# Patient Record
Sex: Female | Born: 1960 | Race: White | Hispanic: No | Smoking: Never smoker
Health system: Southern US, Community
[De-identification: ages and names within clinical notes are randomized; demographics above are authoritative.]

## PROBLEM LIST (undated history)

## (undated) DIAGNOSIS — E079 Disorder of thyroid, unspecified: Secondary | ICD-10-CM

## (undated) DIAGNOSIS — I10 Essential (primary) hypertension: Secondary | ICD-10-CM

---

## 1998-09-30 ENCOUNTER — Encounter (INDEPENDENT_AMBULATORY_CARE_PROVIDER_SITE_OTHER): Payer: Self-pay

## 1998-09-30 ENCOUNTER — Ambulatory Visit (HOSPITAL_COMMUNITY): Admission: RE | Admit: 1998-09-30 | Discharge: 1998-09-30 | Payer: Self-pay | Admitting: Obstetrics and Gynecology

## 1999-11-02 ENCOUNTER — Encounter: Payer: Self-pay | Admitting: Family Medicine

## 1999-11-02 ENCOUNTER — Encounter: Admission: RE | Admit: 1999-11-02 | Discharge: 1999-11-02 | Payer: Self-pay | Admitting: Family Medicine

## 2001-06-20 ENCOUNTER — Encounter: Admission: RE | Admit: 2001-06-20 | Discharge: 2001-06-20 | Payer: Self-pay | Admitting: Family Medicine

## 2001-06-20 ENCOUNTER — Encounter: Payer: Self-pay | Admitting: Family Medicine

## 2002-07-17 ENCOUNTER — Encounter: Payer: Self-pay | Admitting: Family Medicine

## 2002-07-17 ENCOUNTER — Encounter: Admission: RE | Admit: 2002-07-17 | Discharge: 2002-07-17 | Payer: Self-pay | Admitting: Family Medicine

## 2003-08-20 ENCOUNTER — Encounter: Admission: RE | Admit: 2003-08-20 | Discharge: 2003-08-20 | Payer: Self-pay | Admitting: Family Medicine

## 2004-11-17 ENCOUNTER — Encounter: Admission: RE | Admit: 2004-11-17 | Discharge: 2004-11-17 | Payer: Self-pay | Admitting: Family Medicine

## 2006-04-04 ENCOUNTER — Encounter: Admission: RE | Admit: 2006-04-04 | Discharge: 2006-04-04 | Payer: Self-pay | Admitting: Family Medicine

## 2007-07-14 ENCOUNTER — Encounter: Admission: RE | Admit: 2007-07-14 | Discharge: 2007-07-14 | Payer: Self-pay | Admitting: Family Medicine

## 2008-10-21 ENCOUNTER — Encounter: Admission: RE | Admit: 2008-10-21 | Discharge: 2008-10-21 | Payer: Self-pay | Admitting: Family Medicine

## 2009-11-14 ENCOUNTER — Encounter: Admission: RE | Admit: 2009-11-14 | Discharge: 2009-11-14 | Payer: Self-pay | Admitting: Family Medicine

## 2010-01-29 ENCOUNTER — Encounter: Payer: Self-pay | Admitting: Family Medicine

## 2011-02-22 ENCOUNTER — Other Ambulatory Visit: Payer: Self-pay | Admitting: Family Medicine

## 2011-02-22 DIAGNOSIS — Z1231 Encounter for screening mammogram for malignant neoplasm of breast: Secondary | ICD-10-CM

## 2011-02-26 ENCOUNTER — Ambulatory Visit: Payer: Self-pay

## 2011-03-12 ENCOUNTER — Ambulatory Visit
Admission: RE | Admit: 2011-03-12 | Discharge: 2011-03-12 | Disposition: A | Discharge status: Home / Self Care | Payer: BC Managed Care – PPO | Source: Ambulatory Visit | Attending: Family Medicine | Admitting: Family Medicine

## 2011-03-12 DIAGNOSIS — Z1231 Encounter for screening mammogram for malignant neoplasm of breast: Secondary | ICD-10-CM

## 2012-06-12 ENCOUNTER — Other Ambulatory Visit: Payer: Self-pay

## 2012-06-12 DIAGNOSIS — Z1231 Encounter for screening mammogram for malignant neoplasm of breast: Secondary | ICD-10-CM

## 2012-07-02 ENCOUNTER — Ambulatory Visit
Admission: RE | Admit: 2012-07-02 | Discharge: 2012-07-02 | Disposition: A | Discharge status: Home / Self Care | Payer: 59 | Source: Ambulatory Visit

## 2012-07-02 DIAGNOSIS — Z1231 Encounter for screening mammogram for malignant neoplasm of breast: Secondary | ICD-10-CM

## 2013-08-18 ENCOUNTER — Other Ambulatory Visit: Payer: Self-pay

## 2013-08-18 DIAGNOSIS — Z1231 Encounter for screening mammogram for malignant neoplasm of breast: Secondary | ICD-10-CM

## 2013-09-02 ENCOUNTER — Ambulatory Visit: Payer: 59

## 2013-09-09 ENCOUNTER — Ambulatory Visit
Admission: RE | Admit: 2013-09-09 | Discharge: 2013-09-09 | Disposition: A | Discharge status: Home / Self Care | Payer: 59 | Source: Ambulatory Visit

## 2013-09-09 DIAGNOSIS — Z1231 Encounter for screening mammogram for malignant neoplasm of breast: Secondary | ICD-10-CM

## 2014-09-10 ENCOUNTER — Other Ambulatory Visit: Payer: Self-pay

## 2014-09-10 DIAGNOSIS — Z1231 Encounter for screening mammogram for malignant neoplasm of breast: Secondary | ICD-10-CM

## 2014-09-27 ENCOUNTER — Ambulatory Visit: Payer: Self-pay

## 2014-10-04 ENCOUNTER — Ambulatory Visit
Admission: RE | Admit: 2014-10-04 | Discharge: 2014-10-04 | Disposition: A | Discharge status: Home / Self Care | Payer: 59 | Source: Ambulatory Visit

## 2014-10-04 DIAGNOSIS — Z1231 Encounter for screening mammogram for malignant neoplasm of breast: Secondary | ICD-10-CM

## 2015-09-13 ENCOUNTER — Other Ambulatory Visit: Payer: Self-pay | Admitting: Internal Medicine

## 2015-09-13 ENCOUNTER — Other Ambulatory Visit: Payer: Self-pay | Admitting: Family Medicine

## 2015-09-13 DIAGNOSIS — Z1231 Encounter for screening mammogram for malignant neoplasm of breast: Secondary | ICD-10-CM

## 2015-10-06 ENCOUNTER — Ambulatory Visit
Admission: RE | Admit: 2015-10-06 | Discharge: 2015-10-06 | Disposition: A | Discharge status: Home / Self Care | Payer: 59 | Source: Ambulatory Visit | Attending: Internal Medicine | Admitting: Internal Medicine

## 2015-10-06 DIAGNOSIS — Z1231 Encounter for screening mammogram for malignant neoplasm of breast: Secondary | ICD-10-CM

## 2015-10-12 ENCOUNTER — Other Ambulatory Visit: Payer: Self-pay | Admitting: Internal Medicine

## 2015-10-12 DIAGNOSIS — R928 Other abnormal and inconclusive findings on diagnostic imaging of breast: Secondary | ICD-10-CM

## 2015-10-17 ENCOUNTER — Ambulatory Visit
Admission: RE | Admit: 2015-10-17 | Discharge: 2015-10-17 | Disposition: A | Discharge status: Home / Self Care | Payer: 59 | Source: Ambulatory Visit | Attending: Internal Medicine | Admitting: Internal Medicine

## 2015-10-17 DIAGNOSIS — R928 Other abnormal and inconclusive findings on diagnostic imaging of breast: Secondary | ICD-10-CM

## 2016-05-28 ENCOUNTER — Other Ambulatory Visit: Payer: Self-pay | Admitting: Internal Medicine

## 2016-05-28 DIAGNOSIS — N6489 Other specified disorders of breast: Secondary | ICD-10-CM

## 2016-06-05 ENCOUNTER — Ambulatory Visit
Admission: RE | Admit: 2016-06-05 | Discharge: 2016-06-05 | Disposition: A | Discharge status: Home / Self Care | Payer: 59 | Source: Ambulatory Visit | Attending: Internal Medicine | Admitting: Internal Medicine

## 2016-06-05 DIAGNOSIS — N6489 Other specified disorders of breast: Secondary | ICD-10-CM

## 2016-08-27 ENCOUNTER — Other Ambulatory Visit: Payer: Self-pay | Admitting: Internal Medicine

## 2016-08-27 DIAGNOSIS — N6489 Other specified disorders of breast: Secondary | ICD-10-CM

## 2016-10-25 ENCOUNTER — Ambulatory Visit
Admission: RE | Admit: 2016-10-25 | Discharge: 2016-10-25 | Disposition: A | Discharge status: Home / Self Care | Payer: 59 | Source: Ambulatory Visit | Attending: Internal Medicine | Admitting: Internal Medicine

## 2016-10-25 DIAGNOSIS — N6489 Other specified disorders of breast: Secondary | ICD-10-CM

## 2017-09-26 ENCOUNTER — Other Ambulatory Visit: Payer: Self-pay | Admitting: Internal Medicine

## 2017-09-26 DIAGNOSIS — Z1231 Encounter for screening mammogram for malignant neoplasm of breast: Secondary | ICD-10-CM

## 2017-10-31 ENCOUNTER — Ambulatory Visit: Payer: 59

## 2017-11-04 ENCOUNTER — Ambulatory Visit
Admission: RE | Admit: 2017-11-04 | Discharge: 2017-11-04 | Disposition: A | Discharge status: Home / Self Care | Payer: 59 | Source: Ambulatory Visit | Attending: Internal Medicine | Admitting: Internal Medicine

## 2017-11-04 DIAGNOSIS — Z1231 Encounter for screening mammogram for malignant neoplasm of breast: Secondary | ICD-10-CM

## 2017-11-12 ENCOUNTER — Ambulatory Visit: Payer: 59

## 2018-10-03 ENCOUNTER — Other Ambulatory Visit: Payer: Self-pay | Admitting: Family Medicine

## 2018-10-03 DIAGNOSIS — Z1231 Encounter for screening mammogram for malignant neoplasm of breast: Secondary | ICD-10-CM

## 2018-11-20 ENCOUNTER — Ambulatory Visit
Admission: RE | Admit: 2018-11-20 | Discharge: 2018-11-20 | Disposition: A | Discharge status: Home / Self Care | Payer: 59 | Source: Ambulatory Visit | Attending: Family Medicine | Admitting: Family Medicine

## 2018-11-20 ENCOUNTER — Other Ambulatory Visit: Payer: Self-pay

## 2018-11-20 DIAGNOSIS — Z1231 Encounter for screening mammogram for malignant neoplasm of breast: Secondary | ICD-10-CM

## 2019-03-13 ENCOUNTER — Other Ambulatory Visit: Payer: Self-pay | Admitting: Family Medicine

## 2019-03-13 DIAGNOSIS — Z1231 Encounter for screening mammogram for malignant neoplasm of breast: Secondary | ICD-10-CM

## 2019-05-05 ENCOUNTER — Ambulatory Visit: Payer: 59

## 2019-08-20 DIAGNOSIS — R7301 Impaired fasting glucose: Secondary | ICD-10-CM | POA: Insufficient documentation

## 2019-10-01 ENCOUNTER — Ambulatory Visit
Admission: RE | Admit: 2019-10-01 | Discharge: 2019-10-01 | Disposition: A | Discharge status: Home / Self Care | Payer: BLUE CROSS/BLUE SHIELD | Source: Ambulatory Visit | Attending: Family Medicine | Admitting: Family Medicine

## 2019-10-01 ENCOUNTER — Other Ambulatory Visit: Payer: Self-pay

## 2019-10-01 DIAGNOSIS — Z1231 Encounter for screening mammogram for malignant neoplasm of breast: Secondary | ICD-10-CM

## 2019-10-26 ENCOUNTER — Ambulatory Visit: Payer: BLUE CROSS/BLUE SHIELD

## 2019-11-03 ENCOUNTER — Ambulatory Visit
Admission: RE | Admit: 2019-11-03 | Discharge: 2019-11-03 | Disposition: A | Discharge status: Home / Self Care | Payer: BLUE CROSS/BLUE SHIELD | Source: Ambulatory Visit | Attending: Family Medicine | Admitting: Family Medicine

## 2019-11-03 ENCOUNTER — Other Ambulatory Visit: Payer: Self-pay | Admitting: Family Medicine

## 2019-11-03 ENCOUNTER — Other Ambulatory Visit: Payer: Self-pay

## 2019-11-03 DIAGNOSIS — Z1231 Encounter for screening mammogram for malignant neoplasm of breast: Secondary | ICD-10-CM

## 2019-11-03 LAB — HM MAMMOGRAPHY

## 2020-10-24 ENCOUNTER — Other Ambulatory Visit: Payer: Self-pay | Admitting: Family Medicine

## 2020-10-24 DIAGNOSIS — Z1231 Encounter for screening mammogram for malignant neoplasm of breast: Secondary | ICD-10-CM

## 2020-12-06 ENCOUNTER — Ambulatory Visit: Payer: BC Managed Care – PPO

## 2020-12-22 ENCOUNTER — Ambulatory Visit: Payer: BC Managed Care – PPO | Admitting: Family Medicine

## 2021-03-20 ENCOUNTER — Encounter (HOSPITAL_BASED_OUTPATIENT_CLINIC_OR_DEPARTMENT_OTHER): Payer: Self-pay | Admitting: *Deleted

## 2021-03-20 ENCOUNTER — Emergency Department (HOSPITAL_BASED_OUTPATIENT_CLINIC_OR_DEPARTMENT_OTHER)
Admission: EM | Admit: 2021-03-20 | Discharge: 2021-03-20 | Disposition: A | Discharge status: Home / Self Care | Payer: No Typology Code available for payment source | Attending: Emergency Medicine | Admitting: Emergency Medicine

## 2021-03-20 ENCOUNTER — Other Ambulatory Visit: Payer: Self-pay

## 2021-03-20 DIAGNOSIS — R002 Palpitations: Secondary | ICD-10-CM | POA: Insufficient documentation

## 2021-03-20 DIAGNOSIS — I10 Essential (primary) hypertension: Secondary | ICD-10-CM | POA: Diagnosis not present

## 2021-03-20 DIAGNOSIS — R42 Dizziness and giddiness: Secondary | ICD-10-CM | POA: Insufficient documentation

## 2021-03-20 DIAGNOSIS — E782 Mixed hyperlipidemia: Secondary | ICD-10-CM | POA: Insufficient documentation

## 2021-03-20 DIAGNOSIS — E039 Hypothyroidism, unspecified: Secondary | ICD-10-CM | POA: Insufficient documentation

## 2021-03-20 DIAGNOSIS — K219 Gastro-esophageal reflux disease without esophagitis: Secondary | ICD-10-CM | POA: Insufficient documentation

## 2021-03-20 DIAGNOSIS — Z79899 Other long term (current) drug therapy: Secondary | ICD-10-CM | POA: Diagnosis not present

## 2021-03-20 DIAGNOSIS — J309 Allergic rhinitis, unspecified: Secondary | ICD-10-CM | POA: Insufficient documentation

## 2021-03-20 HISTORY — DX: Essential (primary) hypertension: I10

## 2021-03-20 HISTORY — DX: Disorder of thyroid, unspecified: E07.9

## 2021-03-20 LAB — CBC WITH DIFFERENTIAL/PLATELET
Abs Immature Granulocytes: 0.02 10*3/uL (ref 0.00–0.07)
Basophils Absolute: 0.1 10*3/uL (ref 0.0–0.1)
Basophils Relative: 1 %
Eosinophils Absolute: 0.1 10*3/uL (ref 0.0–0.5)
Eosinophils Relative: 1 %
HCT: 40.8 % (ref 36.0–46.0)
Hemoglobin: 14.1 g/dL (ref 12.0–15.0)
Immature Granulocytes: 0 %
Lymphocytes Relative: 28 %
Lymphs Abs: 2.6 10*3/uL (ref 0.7–4.0)
MCH: 31.1 pg (ref 26.0–34.0)
MCHC: 34.6 g/dL (ref 30.0–36.0)
MCV: 90.1 fL (ref 80.0–100.0)
Monocytes Absolute: 0.8 10*3/uL (ref 0.1–1.0)
Monocytes Relative: 9 %
Neutro Abs: 5.6 10*3/uL (ref 1.7–7.7)
Neutrophils Relative %: 61 %
Platelets: 308 10*3/uL (ref 150–400)
RBC: 4.53 MIL/uL (ref 3.87–5.11)
RDW: 11.9 % (ref 11.5–15.5)
WBC: 9.2 10*3/uL (ref 4.0–10.5)
nRBC: 0 % (ref 0.0–0.2)

## 2021-03-20 LAB — BASIC METABOLIC PANEL
Anion gap: 9 (ref 5–15)
BUN: 13 mg/dL (ref 8–23)
CO2: 23 mmol/L (ref 22–32)
Calcium: 9.4 mg/dL (ref 8.9–10.3)
Chloride: 107 mmol/L (ref 98–111)
Creatinine, Ser: 0.77 mg/dL (ref 0.44–1.00)
GFR, Estimated: >60 mL/min (ref >60)
Glucose, Bld: 120 mg/dL — ABNORMAL HIGH (ref 70–99)
Potassium: 3.7 mmol/L (ref 3.5–5.1)
Sodium: 139 mmol/L (ref 135–145)

## 2021-03-20 MED ORDER — SODIUM CHLORIDE 0.9 % IV BOLUS
1000.0000 mL | Freq: Once | INTRAVENOUS | Status: AC
Start: 2021-03-20 — End: 2021-03-20
  Administered 2021-03-20: 1000 mL via INTRAVENOUS

## 2021-03-20 NOTE — ED Notes (Signed)
ED Provider at bedside. 

## 2021-03-20 NOTE — ED Provider Notes (Signed)
Patient signed out to me is pending labs and repeat evaluation.  Blood test unremarkable chemistry and white count are normal. ? ?On my exam patient has no focal neurodeficit, she is amatory without any assistance.  She is able to bend down to pick up her shoes stand up and turn around and walk without any fall or unstable gait.  Finger-nose and heel-to-shin are intact strength is 5/5 all extremities. ? ?Etiology of her intermittent episodes of dizziness unclear, appears to have improved here in the ER with IV fluid resuscitation.  Recommending outpatient follow-up with her doctors within the week, recommend immediate return for recurrent, worsening symptoms, pain fevers or any additional concerns. ?  ?Cheryll Cockayne, MD ?03/20/21 [phone] ? ?

## 2021-03-20 NOTE — ED Provider Notes (Incomplete)
[facility] EMERGENCY DEPT Provider Note   CSN: [id number] Arrival date & time: 03/20/21  2505     History {Add pertinent medical, surgical, social history, OB history to HPI:1} Chief Complaint  Patient presents with   Dizziness    Elizabeth Harrison is a 61 y.o. female.  HPI     This is a 61 year old female with a history of high blood pressure who presents with dizziness.  Patient reports she has had progressive lightheadedness worse this morning.  She states that it is normally worse at night.  She denies room spinning dizziness.  She states that she feels lightheaded when she stands up.  Denies any recent illnesses or decreased p.o. intake.  Denies any active bleeding.  She states she had some issues with dizziness "a long time ago."  Denies any weakness, numbness, strokelike symptoms.  Denies any new medications or changes in medication.  She did recently start a new job and feels more stressed.  She also recently stopped eating red meat and felt that her blood pressure may be dropping.  Home Medications Prior to Admission medications   Medication Sig Start Date End Date Taking? Authorizing Provider  levothyroxine (SYNTHROID) 75 MCG tablet TAKE 1 TAB DAILY WITH WATER ON AN EMPTY STOMACH 1ST THING IN THE MORNING 30 MINUTES BEFORE BREAKFAST 09/14/20  Yes [provider]  loratadine (CLARITIN) 10 MG tablet Take by mouth. 08/16/17  Yes [provider]  metoprolol succinate (TOPROL-XL) 50 MG 24 hr tablet Take 1 tablet by mouth daily. 09/14/20  Yes [provider]      Allergies    Patient has no known allergies.    Review of Systems   Review of Systems  Constitutional:  Negative for fever.  Respiratory:  Negative for shortness of breath.   Cardiovascular:  Negative for chest pain.  Gastrointestinal:  Negative for nausea and vomiting.  Neurological:  Positive for light-headedness. Negative for headaches.  All other systems reviewed and are  negative.  Physical Exam Updated Vital Signs BP (!) 162/90 (BP Location: Right Arm)    Pulse 84    Temp 98.6 F (37 C) (Oral)    Resp 17    Ht 1.6 m (5\' 3" )    Wt 90.7 kg    SpO2 99%    BMI 35.43 kg/m  Physical Exam Vitals and nursing note reviewed.  Constitutional:      Appearance: She is well-developed. She is obese. She is not ill-appearing.  HENT:     Head: Normocephalic and atraumatic.     Nose: Nose normal.     Mouth/Throat:     Mouth: Mucous membranes are moist.  Eyes:     Extraocular Movements: Extraocular movements intact.     Pupils: Pupils are equal, round, and reactive to light.     Comments: No nystagmus  Cardiovascular:     Rate and Rhythm: Normal rate and regular rhythm.     Heart sounds: Normal heart sounds.  Pulmonary:     Effort: Pulmonary effort is normal. No respiratory distress.     Breath sounds: No wheezing.  Abdominal:     General: Bowel sounds are normal.     Palpations: Abdomen is soft.     Tenderness: There is no abdominal tenderness.  Musculoskeletal:     Cervical back: Neck supple.     Right lower leg: No edema.     Left lower leg: No edema.  Skin:    General: Skin is warm and dry.  Neurological:     Mental Status: She is alert and oriented to person, place, and time.     Comments: Cranial nerves II through XII intact, 5 out of 5 strength in all 4 extremities, no ataxia  Psychiatric:        Mood and Affect: Mood normal.    ED Results / Procedures / Treatments   Labs (all labs ordered are listed, but only abnormal results are displayed) Labs Reviewed  CBC WITH DIFFERENTIAL/PLATELET  BASIC METABOLIC PANEL    EKG None  Radiology No results found.  Procedures Procedures  {Document cardiac monitor, telemetry assessment procedure when appropriate:1}  Medications Ordered in ED Medications  sodium chloride 0.9 % bolus 1,000 mL (has no administration in time range)    ED Course/ Medical Decision Making/ A&P                            Medical Decision Making Amount and/or Complexity of Data Reviewed Labs: ordered.   ***  {Document critical care time when appropriate:1} {Document review of labs and clinical decision tools ie heart score, Chads2Vasc2 etc:1}  {Document your independent review of radiology images, and any outside records:1} {Document your discussion with family members, caretakers, and with consultants:1} {Document social determinants of health affecting pt's care:1} {Document your decision making why or why not admission, treatments were needed:1} Final Clinical Impression(s) / ED Diagnoses Final diagnoses:  None    Rx / DC Orders ED Discharge Orders     None

## 2021-03-20 NOTE — Discharge Instructions (Addendum)
Call your primary care doctor or specialist as discussed in the next 2-3 days.   Return immediately back to the ER if:  Your symptoms worsen within the next 12-24 hours. You develop new symptoms such as new fevers, persistent vomiting, new pain, shortness of breath, or new weakness or numbness, or if you have any other concerns.  

## 2021-03-20 NOTE — ED Triage Notes (Signed)
Pt says that over the past week she has had some dizziness on and off. This morning feeling more dizzy than she has felt this week. Worse when she stands up.  ?

## 2021-03-30 ENCOUNTER — Ambulatory Visit (INDEPENDENT_AMBULATORY_CARE_PROVIDER_SITE_OTHER): Payer: No Typology Code available for payment source | Admitting: Family Medicine

## 2021-03-30 ENCOUNTER — Encounter: Payer: Self-pay | Admitting: Family Medicine

## 2021-03-30 ENCOUNTER — Other Ambulatory Visit: Payer: Self-pay

## 2021-03-30 VITALS — BP 143/88 | HR 79 | Resp 18 | Ht 63.0 in | Wt 202.0 lb

## 2021-03-30 DIAGNOSIS — E039 Hypothyroidism, unspecified: Secondary | ICD-10-CM

## 2021-03-30 DIAGNOSIS — I10 Essential (primary) hypertension: Secondary | ICD-10-CM

## 2021-03-30 DIAGNOSIS — R7301 Impaired fasting glucose: Secondary | ICD-10-CM | POA: Diagnosis not present

## 2021-03-30 MED ORDER — LEVOTHYROXINE SODIUM 75 MCG PO TABS: ORAL_TABLET | ORAL | 1 refills | Status: DC

## 2021-03-30 MED ORDER — METOPROLOL SUCCINATE ER 50 MG PO TB24: 50.0000 mg | ORAL_TABLET | Freq: Every day | ORAL | 3 refills | Status: DC

## 2021-03-30 NOTE — Progress Notes (Signed)
? ?Established Patient Office Visit ? ?Subjective:  ?Patient ID: Elizabeth Harrison, female    DOB: [birth year]  Age: 61 y.o. MRN: [id number] ? ?CC:  ?Chief Complaint  ?Patient presents with  ? Establish Care  ? Hypertension  ?  Follow up   ? Pap Smear  ?  Patient will schedule pap smear with [facility]  ? Mammogram  ?  Patient will call to schedule appointment with [facility].   ? Colonoscopy  ?  Patient would like to research a provider first before scheduling.   ? ? ?HPI ?Elizabeth Harrison presents to establish care.  She is transferring care from her previous provider I have seen her daughter in no her for several years. ? ?She has a personal history of hypertension and hypothyroidism she has been on her current medication regimen for quite some time nothing has changed recently.  Last time she was seen by primary care was in August, about 7 months ago. ? ?He has had several losses in the last couple years.  She had 1 brother who passed away from complications from A-fib and 1 brother who died from complications from [place].  She does try to stay busy with walking and yard work and just says that normally she is a pretty happy-go-lucky person but since the loss of both of her brothers recently she has had a few more days where she has felt a little bit more down.  But overall feels like she is dealing with things well. ? ?She did have oral surgery in the fall and says around that time she decided to give up eating red meat and has actually been gradually losing a little bit of weight since then she says her goal is to continue to work on weight loss.  She eventually wants to get completely off of her blood pressure medications.  She says so far she is actually lost about 13 pounds which is fantastic.  She was previously weighing in at 215. ? 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 ? ?Pretension-she was diagnosed over a decade ago.  And has been on metoprolol since then. ? ?Past  Medical History:  ?Diagnosis Date  ? Hypertension   ? Thyroid disease   ? ? ?Last mammogram was November 05, 2019.  ?Per KP until last colonoscopy was October 29, 2011.  We will need to get that abstracted. ? ?History reviewed. No pertinent surgical history. ? ?Family History  ?Problem Relation Age of Onset  ? Multiple myeloma Mother   ? Healthy Sister   ? Healthy Sister   ? ? ?Social History  ? ?Socioeconomic History  ? Marital status: Legally Separated  ?  Spouse name: Not on file  ? Number of children: 1  ? Years of education: Not on file  ? Highest education level: Not on file  ?Occupational History  ? Occupation: [employer]  ?Tobacco Use  ? Smoking status: Never  ? Smokeless tobacco: Never  ?Substance and Sexual Activity  ? Alcohol use: Not Currently  ? Drug use: Not Currently  ? Sexual activity: Not on file  ?Other Topics Concern  ? Not on file  ?Social History Narrative  ? No exercise. No Caffeine.   ? ?Social Determinants of Health  ? ?Financial Resource Strain: Not on file  ?Food Insecurity: Not on file  ?Transportation Needs: Not on file  ?Physical Activity: Not on file  ?Stress: Not on file  ?Social Connections: Not  on file  ?Intimate Partner Violence: Not on file  ? ? ?Outpatient Medications Prior to Visit  ?Medication Sig Dispense Refill  ? loratadine (CLARITIN) 10 MG tablet Take by mouth.    ? levothyroxine (SYNTHROID) 75 MCG tablet TAKE 1 TAB DAILY WITH WATER ON AN EMPTY STOMACH 1ST THING IN THE MORNING 30 MINUTES BEFORE BREAKFAST    ? metoprolol succinate (TOPROL-XL) 50 MG 24 hr tablet Take 1 tablet by mouth daily.    ? ?No facility-administered medications prior to visit.  ? ? ?No Known Allergies ? ?ROS ?Review of Systems ? ?  ?Objective:  ?  ?Physical Exam ? ?BP (!) 143/88 (BP Location: Left Arm)   Pulse 79   Resp 18   Ht _0  (1.6 m)   Wt 202 lb (91.6 kg)   SpO2 97%   BMI 35.78 kg/m?  ?Wt Readings from Last 3 Encounters:  ?03/30/21 202 lb (91.6 kg)  ?03/20/21 200 lb (90.7  kg)  ? ? ? ?Health Maintenance Due  ?Topic Date Due  ? HIV Screening  Never done  ? Hepatitis C Screening  Never done  ? PAP SMEAR-Modifier  Never done  ? COLONOSCOPY (Pts 45-43yr Insurance coverage will need to be confirmed)  Never done  ? ? ?There are no preventive care reminders to display for this patient. ? ?Lab Results  ?Component Value Date  ? TSH 1.23 03/30/2021  ? ?Lab Results  ?Component Value Date  ? WBC 9.2 03/20/2021  ? HGB 14.1 03/20/2021  ? HCT 40.8 03/20/2021  ? MCV 90.1 03/20/2021  ? PLT 308 03/20/2021  ? ?Lab Results  ?Component Value Date  ? NA 139 03/30/2021  ? K 4.4 03/30/2021  ? CO2 30 03/30/2021  ? GLUCOSE 93 03/30/2021  ? BUN 13 03/30/2021  ? CREATININE 0.78 03/30/2021  ? CALCIUM 9.7 03/30/2021  ? ANIONGAP 9 03/20/2021  ? EGFR 86 03/30/2021  ? ?No results found for: CHOL ?No results found for: HDL ?No results found for: LE[place]?No results found for: TRIG ?No results found for: CHOLHDL ?Lab Results  ?Component Value Date  ? HGBA1C 5.7 (H) 03/30/2021  ? ? ?  ?Assessment & Plan:  ? ?Problem List Items Addressed This Visit   ? ?  ? Cardiovascular and Mediastinum  ? Essential hypertension - Primary  ?  Pressure mildly elevated today.  It looked great about 6 months ago per old records at 120/84 so for now we will just continue to monitor.  Ultimately her goal is to get off of blood pressure medication and continue to work on weight loss and healthy diet and exercise.  I think that would be a great goal. ?  ?  ? Relevant Medications  ? metoprolol succinate (TOPROL-XL) 50 MG 24 hr tablet  ? Other Relevant Orders  ? BASIC METABOLIC PANEL WITH GFR (Completed)  ?  ? Endocrine  ? Impaired fasting glucose  ?  A1c just slightly elevated today still in the prediabetes range.  Again continue to work on healthy food choices and regular exercise and we will plan to recheck again in 6 months.  Is not currently on any medication for treatment. ?  ?  ? Relevant Orders  ? Hemoglobin A1c (Completed)  ? Acquired  hypothyroidism  ?  Plan to check TSH today and make any adjustments needed.  She feels like she is doing well on her current regimen. ?  ?  ? Relevant Medications  ? levothyroxine (SYNTHROID) 75 MCG tablet  ?  metoprolol succinate (TOPROL-XL) 50 MG 24 hr tablet  ? Other Relevant Orders  ? TSH (Completed)  ?  ? Other  ? Severe obesity (BMI 35.0-39.9) with comorbidity ([place])  ?  She is doing fantastic.  Already down 13 pounds which is phenomenal encouraged her to just continue to make those healthy choices.  He really feels like cutting back on the red meat has made a huge difference. ?  ?  ? ? ?Meds ordered this encounter  ?Medications  ? levothyroxine (SYNTHROID) 75 MCG tablet  ?  Sig: TAKE 1 TAB DAILY WITH WATER ON AN EMPTY STOMACH 1ST THING IN THE MORNING 30 MINUTES BEFORE BREAKFAST  ?  Dispense:  90 tablet  ?  Refill:  1  ? metoprolol succinate (TOPROL-XL) 50 MG 24 hr tablet  ?  Sig: Take 1 tablet (50 mg total) by mouth daily.  ?  Dispense:  90 tablet  ?  Refill:  3  ? ? ?Follow-up: No follow-ups on file.  ? ? ?Beatrice Lecher, MD ?

## 2021-03-31 LAB — BASIC METABOLIC PANEL WITH GFR
BUN: 13 mg/dL (ref 7–25)
CO2: 30 mmol/L (ref 20–32)
Calcium: 9.7 mg/dL (ref 8.6–10.4)
Chloride: 103 mmol/L (ref 98–110)
Creat: 0.78 mg/dL (ref 0.50–1.05)
Glucose, Bld: 93 mg/dL (ref 65–99)
Potassium: 4.4 mmol/L (ref 3.5–5.3)
Sodium: 139 mmol/L (ref 135–146)
eGFR: 86 mL/min/{1.73_m2} (ref >=60)

## 2021-03-31 LAB — HEMOGLOBIN A1C
Hgb A1c MFr Bld: 5.7 % of total Hgb — ABNORMAL HIGH (ref <5.7)
Mean Plasma Glucose: 117 mg/dL
eAG (mmol/L): 6.5 mmol/L

## 2021-03-31 LAB — TSH: TSH: 1.23 mIU/L (ref 0.40–4.50)

## 2021-03-31 NOTE — Progress Notes (Signed)
Call patient: A1c is 5.7 still in the prediabetes range but on the lower end which is good just continue to work on healthy food choices and regular exercise.  We will plan to recheck that again in 6 months.  Metabolic panel including kidney function looks good.  Thyroid is normal at 1.2 which is perfect.

## 2021-03-31 NOTE — Assessment & Plan Note (Signed)
She is doing fantastic.  Already down 13 pounds which is phenomenal encouraged her to just continue to make those healthy choices.  He really feels like cutting back on the red meat has made a huge difference. ?

## 2021-03-31 NOTE — Assessment & Plan Note (Signed)
A1c just slightly elevated today still in the prediabetes range.  Again continue to work on healthy food choices and regular exercise and we will plan to recheck again in 6 months.  Is not currently on any medication for treatment. ?

## 2021-03-31 NOTE — Assessment & Plan Note (Signed)
Pressure mildly elevated today.  It looked great about 6 months ago per old records at 120/84 so for now we will just continue to monitor.  Ultimately her goal is to get off of blood pressure medication and continue to work on weight loss and healthy diet and exercise.  I think that would be a great goal. ?

## 2021-03-31 NOTE — Assessment & Plan Note (Signed)
Plan to check TSH today and make any adjustments needed.  She feels like she is doing well on her current regimen. ?

## 2021-05-26 ENCOUNTER — Encounter: Payer: Self-pay | Admitting: Family Medicine

## 2021-06-29 ENCOUNTER — Inpatient Hospital Stay: Admission: RE | Admit: 2021-06-29 | Payer: No Typology Code available for payment source | Source: Ambulatory Visit

## 2021-06-29 ENCOUNTER — Ambulatory Visit
Admission: RE | Admit: 2021-06-29 | Discharge: 2021-06-29 | Disposition: A | Discharge status: Home / Self Care | Payer: No Typology Code available for payment source | Source: Ambulatory Visit | Attending: Family Medicine | Admitting: Family Medicine

## 2021-06-29 DIAGNOSIS — Z1231 Encounter for screening mammogram for malignant neoplasm of breast: Secondary | ICD-10-CM

## 2021-08-25 LAB — HM PAP SMEAR: HM Pap smear: NEGATIVE

## 2021-10-20 ENCOUNTER — Other Ambulatory Visit: Payer: Self-pay | Admitting: Family Medicine

## 2021-10-20 DIAGNOSIS — E039 Hypothyroidism, unspecified: Secondary | ICD-10-CM

## 2021-11-14 ENCOUNTER — Telehealth: Payer: Self-pay | Admitting: Family Medicine

## 2021-11-14 NOTE — Telephone Encounter (Signed)
Pls call: did she get schedule for her pap and mammo at [facility]. We haven't seen a report come through. Also she is due for her 6 mo f/u here. We can do her pap and mammo here if she would like

## 2021-11-16 NOTE — Telephone Encounter (Signed)
LVM asking pt to call back about mammo and pap.

## 2021-11-17 ENCOUNTER — Ambulatory Visit: Payer: No Typology Code available for payment source | Admitting: Family Medicine

## 2021-11-17 NOTE — Telephone Encounter (Signed)
Chart updated with this information.

## 2021-11-17 NOTE — Telephone Encounter (Signed)
Patient called to give last dates of Mammogram and Papsmear her Mammogram was on 06-29-21 and her papsmear was done on 08-25-21.

## 2022-03-17 ENCOUNTER — Other Ambulatory Visit: Payer: Self-pay

## 2022-03-17 ENCOUNTER — Ambulatory Visit
Admission: EM | Admit: 2022-03-17 | Discharge: 2022-03-17 | Disposition: A | Discharge status: Home / Self Care | Payer: No Typology Code available for payment source

## 2022-03-17 ENCOUNTER — Encounter: Payer: Self-pay | Admitting: Urgent Care

## 2022-03-17 DIAGNOSIS — U071 COVID-19: Secondary | ICD-10-CM | POA: Diagnosis not present

## 2022-03-17 MED ORDER — PAXLOVID (300/100) 20 X 150 MG & 10 X 100MG PO TBPK: 3.0000 | ORAL_TABLET | Freq: Two times a day (BID) | ORAL | 0 refills | Status: DC

## 2022-03-17 NOTE — Discharge Instructions (Addendum)
You are positive for covid. Start paxlovid today; take 3 tabs twice daily for 5 days. Monitor for adverse reactions, most commonly nausea, vomiting, diarrhea or headache. If these occur, you may stop the medication.  There is not a liquid form of this medication, additionally you cannot crush or cut or chew the tablet.  Please talk with the pharmacist regarding recommendations for swallowing. Rest and stay hydrated with water. Must quarantine until you are >24 hours afebrile. Wear N95 mask when out for 10 days . Please monitor regression of your symptoms. Some people have tried OTC Quercetin to help fight off illness. If any new or worsening symptoms develops, particularly uncontrollable fever, severe shortness of breath or chest pain, please head to the ER.

## 2022-03-17 NOTE — ED Provider Notes (Signed)
[facility]    CSN: [id number] Arrival date & time: 03/17/22  1515      History   Chief Complaint Chief Complaint  Patient presents with   Nasal Congestion   Headache   Generalized Body Aches    HPI Elizabeth Harrison is a 62 y.o. female.   62 year old female presents today due to concerns of a positive COVID test.  States her daughter had COVID and she was exposed.  Patient started having some mild nasal congestion yesterday, but woke up with a severe splitting headache today and body aches "like the flu".  She reports a very sporadic intermittent dry cough.  Took a COVID test at home today which was positive.  Patient denies any shortness of breath or chest pain.  Denies any GI symptoms.  She did take Tylenol this morning which helped with the headache.  No known fever but 100.2 upon presentation.   Headache   Past Medical History:  Diagnosis Date   Hypertension    Thyroid disease     Patient Active Problem List   Diagnosis Date Noted   Essential hypertension 03/20/2021   GERD (gastroesophageal reflux disease) 03/20/2021   Mixed hyperlipidemia 03/20/2021   Palpitations 03/20/2021   Allergic rhinitis 03/20/2021   Acquired hypothyroidism 03/20/2021   Impaired fasting glucose 08/20/2019   Severe obesity (BMI 35.0-39.9) with comorbidity ([place]) 08/20/2019    History reviewed. No pertinent surgical history.  OB History   No obstetric history on file.      Home Medications    Prior to Admission medications   Medication Sig Start Date End Date Taking? Authorizing Provider  ibuprofen (ADVIL) 400 MG tablet Take 400 mg by mouth every 6 (six) hours as needed.   Yes [provider]  levothyroxine (SYNTHROID) 75 MCG tablet TAKE 1 TABLET BY MOUTH ONCE DAILY ON AN EMPTY STOMACH 1ST THING IN THE MORNING 30 MINUTES BEFORE BREAKFAST 10/20/21   Hali Marry, MD  loratadine (CLARITIN) 10 MG tablet Take by mouth. 08/16/17   [provider]   metoprolol succinate (TOPROL-XL) 50 MG 24 hr tablet Take 1 tablet (50 mg total) by mouth daily. 03/30/21   Hali Marry, MD  nirmatrelvir & ritonavir (PAXLOVID, 300/100,) 20 x 150 MG & 10 x '100MG'$  TBPK Take 3 tablets by mouth 2 (two) times daily for 5 days. 03/17/22 03/22/22 Yes Lanier Felty, Sherren Kerns, PA    Family History Family History  Problem Relation Age of Onset   Multiple myeloma Mother    Healthy Sister    Healthy Sister     Social History Social History   Tobacco Use   Smoking status: Never   Smokeless tobacco: Never  Vaping Use   Vaping Use: Never used  Substance Use Topics   Alcohol use: Not Currently   Drug use: Not Currently     Allergies   Patient has no known allergies.   Review of Systems Review of Systems  Neurological:  Positive for headaches.  As per HPI   Physical Exam Triage Vital Signs ED Triage Vitals  Enc Vitals Group     BP 03/17/22 1532 (!) 134/97     Pulse Rate 03/17/22 1532 93     Resp 03/17/22 1532 20     Temp 03/17/22 1532 100.2 F (37.9 C)     Temp Source 03/17/22 1532 Oral     SpO2 03/17/22 1532 97 %     Weight 03/17/22 1531 202 lb (91.6 kg)  Height 03/17/22 1531 '5\' 3"'$  (1.6 m)     Head Circumference --      Peak Flow --      Pain Score 03/17/22 1530 4     Pain Loc --      Pain Edu? --      Excl. in [place]? --    No data found.  Updated Vital Signs BP (!) 143/89 (BP Location: Right Arm)   Pulse 93   Temp 100.2 F (37.9 C) (Oral)   Resp 20   Ht '5\' 3"'$  (1.6 m)   Wt 202 lb (91.6 kg)   SpO2 97%   BMI 35.78 kg/m   Visual Acuity Right Eye Distance:   Left Eye Distance:   Bilateral Distance:    Right Eye Near:   Left Eye Near:    Bilateral Near:     Physical Exam Vitals and nursing note reviewed.  Constitutional:      General: She is not in acute distress.    Appearance: She is well-developed. She is obese. She is ill-appearing. She is not toxic-appearing or diaphoretic.  HENT:     Head: Normocephalic and  atraumatic.     Right Ear: Tympanic membrane, ear canal and external ear normal. There is no impacted cerumen.     Left Ear: Tympanic membrane, ear canal and external ear normal. There is no impacted cerumen.     Nose: Nose normal. No congestion or rhinorrhea.     Mouth/Throat:     Mouth: Mucous membranes are moist.     Pharynx: Oropharynx is clear. No oropharyngeal exudate or posterior oropharyngeal erythema.  Eyes:     General: No scleral icterus.       Right eye: No discharge.        Left eye: No discharge.     Extraocular Movements: Extraocular movements intact.     Conjunctiva/sclera: Conjunctivae normal.     Pupils: Pupils are equal, round, and reactive to light.  Cardiovascular:     Rate and Rhythm: Normal rate and regular rhythm.     Heart sounds: No murmur heard. Pulmonary:     Effort: Pulmonary effort is normal. No accessory muscle usage, respiratory distress or retractions.     Breath sounds: Normal breath sounds and air entry. No stridor, decreased air movement or transmitted upper airway sounds. No decreased breath sounds, wheezing, rhonchi or rales.  Chest:     Chest wall: No tenderness.  Abdominal:     Palpations: Abdomen is soft.     Tenderness: There is no abdominal tenderness.  Musculoskeletal:        General: No swelling.     Cervical back: Normal range of motion and neck supple. No rigidity or tenderness.  Lymphadenopathy:     Cervical: No cervical adenopathy.  Skin:    General: Skin is warm and dry.     Capillary Refill: Capillary refill takes less than 2 seconds.  Neurological:     Mental Status: She is alert.  Psychiatric:        Mood and Affect: Mood normal.      UC Treatments / Results  Labs (all labs ordered are listed, but only abnormal results are displayed) Labs Reviewed - No data to display  EKG   Radiology No results found.  Procedures Procedures (including critical care time)  Medications Ordered in UC Medications - No data to  display  Initial Impression / Assessment and Plan / UC Course  I have reviewed the triage vital signs  and the nursing notes.  Pertinent labs & imaging results that were available during my care of the patient were reviewed by me and considered in my medical decision making (see chart for details).     Covid-19 -patient has risk factors for worsening COVID including hypertension, age, obesity.  Discussed antiviral therapy Paxlovid with patient.  Risks, benefits, indications, side effects reviewed.  Patient hesitant to take as she has trouble swallowing pills due to severe seasonal allergies; requesting liquid medications. Will have pt discuss options with pharmacist as liquid suspension not available and crushing tablet not recommended. No hx of kidney impairment, last GFR and creat normal.   Final Clinical Impressions(s) / UC Diagnoses   Final diagnoses:  COVID-19     Discharge Instructions      You are positive for covid. Start paxlovid today; take 3 tabs twice daily for 5 days. Monitor for adverse reactions, most commonly nausea, vomiting, diarrhea or headache. If these occur, you may stop the medication.  There is not a liquid form of this medication, additionally you cannot crush or cut or chew the tablet.  Please talk with the pharmacist regarding recommendations for swallowing. Rest and stay hydrated with water. Must quarantine until you are >24 hours afebrile. Wear N95 mask when out for 10 days . Please monitor regression of your symptoms. Some people have tried OTC Quercetin to help fight off illness. If any new or worsening symptoms develops, particularly uncontrollable fever, severe shortness of breath or chest pain, please head to the ER.     ED Prescriptions     Medication Sig Dispense Auth. Provider   nirmatrelvir & ritonavir (PAXLOVID, 300/100,) 20 x 150 MG & 10 x '100MG'$  TBPK Take 3 tablets by mouth 2 (two) times daily for 5 days. 30 tablet Giovanni Biby L, [place]       PDMP not reviewed this encounter.   Chaney Malling, [place] 03/17/22 1607

## 2022-03-17 NOTE — ED Triage Notes (Signed)
Pt presents to Urgent Care with c/o nasal congestion, headache, and body aches since yesterday. Reports testing positive for COVID at home today.

## 2022-03-20 ENCOUNTER — Telehealth: Payer: Self-pay | Admitting: Family Medicine

## 2022-03-20 DIAGNOSIS — E039 Hypothyroidism, unspecified: Secondary | ICD-10-CM

## 2022-03-20 DIAGNOSIS — I10 Essential (primary) hypertension: Secondary | ICD-10-CM

## 2022-03-20 MED ORDER — METOPROLOL SUCCINATE ER 50 MG PO TB24: 50.0000 mg | ORAL_TABLET | Freq: Every day | ORAL | 0 refills | Status: DC

## 2022-03-20 MED ORDER — LEVOTHYROXINE SODIUM 75 MCG PO TABS: ORAL_TABLET | ORAL | 0 refills | Status: DC

## 2022-03-20 NOTE — Telephone Encounter (Signed)
Patient called she is scheduled for 04-24-22 she is requesting refills on levothyroxine 75 mcg and metoprolol '50mg'$ 

## 2022-03-20 NOTE — Telephone Encounter (Signed)
Meds ordered this encounter  Medications   levothyroxine (SYNTHROID) 75 MCG tablet    Sig: TAKE 1 TABLET BY MOUTH ONCE DAILY ON AN EMPTY STOMACH 1ST THING IN THE MORNING 30 MINUTES BEFORE BREAKFAST    Dispense:  30 tablet    Refill:  0   metoprolol succinate (TOPROL-XL) 50 MG 24 hr tablet    Sig: Take 1 tablet (50 mg total) by mouth daily.    Dispense:  30 tablet    Refill:  0

## 2022-03-21 NOTE — Telephone Encounter (Signed)
Called and lvm advised pt of refills being sent to her pharmacy/.

## 2022-04-24 ENCOUNTER — Ambulatory Visit (INDEPENDENT_AMBULATORY_CARE_PROVIDER_SITE_OTHER): Payer: No Typology Code available for payment source | Admitting: Family Medicine

## 2022-04-24 ENCOUNTER — Encounter: Payer: Self-pay | Admitting: Family Medicine

## 2022-04-24 VITALS — BP 135/71 | HR 73 | Ht 63.0 in | Wt 198.0 lb

## 2022-04-24 DIAGNOSIS — I10 Essential (primary) hypertension: Secondary | ICD-10-CM | POA: Diagnosis not present

## 2022-04-24 DIAGNOSIS — F439 Reaction to severe stress, unspecified: Secondary | ICD-10-CM

## 2022-04-24 DIAGNOSIS — E039 Hypothyroidism, unspecified: Secondary | ICD-10-CM | POA: Diagnosis not present

## 2022-04-24 DIAGNOSIS — M545 Low back pain, unspecified: Secondary | ICD-10-CM | POA: Insufficient documentation

## 2022-04-24 DIAGNOSIS — M5440 Lumbago with sciatica, unspecified side: Secondary | ICD-10-CM

## 2022-04-24 DIAGNOSIS — G8929 Other chronic pain: Secondary | ICD-10-CM

## 2022-04-24 DIAGNOSIS — R7301 Impaired fasting glucose: Secondary | ICD-10-CM
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
## 2022-04-24 NOTE — Assessment & Plan Note (Signed)
Is actually down 4 pounds which is fantastic.  Just encouraged her to try to incorporate exercise when she is able to.  Encouraged her to also consider getting a sit/stand desk as she does have some problems with her low back intermittently as well as some sciatica.

## 2022-04-24 NOTE — Assessment & Plan Note (Signed)
Currently her sister is living with her but they are trying to help her get some benefits so that she can get her own place.  It has been a little bit stressful.
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
## 2022-04-24 NOTE — Assessment & Plan Note (Signed)
Blood pressure is little borderline today.  Will keep an eye on it.  Will get up-to-date labs.

## 2022-04-24 NOTE — Assessment & Plan Note (Signed)
Recheck A1c.  Her weight is down a little bit and she has been trying to eat a little better.  So hopefully it is stable. Lab Results  Component Value Date   HGBA1C 5.7 (H) 03/30/2021

## 2022-04-25 NOTE — Progress Notes (Signed)
Call pt: still in the prediabetes range but stable. Your triglycerides are high. Continue to work on healthy foods (cutting back on carbs and starchy foods and eating more veggies) and regular exercise.  Your Vitamin D is low.  Please start 25 mcg daily.  Your blood count and thyroid looks great!! Still awaiting 2 more labs.

## 2022-04-30 ENCOUNTER — Encounter: Payer: Self-pay | Admitting: Family Medicine

## 2022-04-30 DIAGNOSIS — E60 Dietary zinc deficiency: Secondary | ICD-10-CM | POA: Insufficient documentation

## 2022-04-30 LAB — COMPLETE METABOLIC PANEL WITH GFR
AG Ratio: 1.6 (calc) (ref 1.0–2.5)
ALT: 13 U/L (ref 6–29)
AST: 16 U/L (ref 10–35)
Albumin: 4.2 g/dL (ref 3.6–5.1)
Alkaline phosphatase (APISO): 63 U/L (ref 37–153)
BUN: 11 mg/dL (ref 7–25)
CO2: 25 mmol/L (ref 20–32)
Calcium: 9.4 mg/dL (ref 8.6–10.4)
Chloride: 105 mmol/L (ref 98–110)
Creat: 0.75 mg/dL (ref 0.50–1.05)
Globulin: 2.6 g/dL (calc) (ref 1.9–3.7)
Glucose, Bld: 115 mg/dL — ABNORMAL HIGH (ref 65–99)
Potassium: 4.5 mmol/L (ref 3.5–5.3)
Sodium: 140 mmol/L (ref 135–146)
Total Bilirubin: 0.6 mg/dL (ref 0.2–1.2)
Total Protein: 6.8 g/dL (ref 6.1–8.1)
eGFR: 89 mL/min/{1.73_m2} (ref >=60)

## 2022-04-30 LAB — VITAMIN D 25 HYDROXY (VIT D DEFICIENCY, FRACTURES): Vit D, 25-Hydroxy: 16 ng/mL — ABNORMAL LOW (ref 30–100)

## 2022-04-30 LAB — CBC
HCT: 40.7 % (ref 35.0–45.0)
Hemoglobin: 13.8 g/dL (ref 11.7–15.5)
MCH: 30.9 pg (ref 27.0–33.0)
MCHC: 33.9 g/dL (ref 32.0–36.0)
MCV: 91.1 fL (ref 80.0–100.0)
MPV: 10.1 fL (ref 7.5–12.5)
Platelets: 321 10*3/uL (ref 140–400)
RBC: 4.47 10*6/uL (ref 3.80–5.10)
RDW: 12.2 % (ref 11.0–15.0)
WBC: 7 10*3/uL (ref 3.8–10.8)

## 2022-04-30 LAB — ZINC: Zinc: 54 ug/dL — ABNORMAL LOW (ref 60–130)

## 2022-04-30 LAB — HEMOGLOBIN A1C
Hgb A1c MFr Bld: 5.8 % of total Hgb — ABNORMAL HIGH (ref <5.7)
Mean Plasma Glucose: 120 mg/dL
eAG (mmol/L): 6.6 mmol/L

## 2022-04-30 LAB — LIPID PANEL W/REFLEX DIRECT LDL
Cholesterol: 165 mg/dL (ref <200)
HDL: 37 mg/dL — ABNORMAL LOW (ref >=50)
LDL Cholesterol (Calc): 94 mg/dL (calc)
Non-HDL Cholesterol (Calc): 128 mg/dL (calc) (ref <130)
Total CHOL/HDL Ratio: 4.5 (calc) (ref <5.0)
Triglycerides: 255 mg/dL — ABNORMAL HIGH (ref <150)

## 2022-04-30 LAB — HEPATITIS C ANTIBODY: Hepatitis C Ab: NONREACTIVE

## 2022-04-30 LAB — TSH: TSH: 1.05 mIU/L (ref 0.40–4.50)

## 2022-04-30 NOTE — Progress Notes (Signed)
Call pt: Your zinc is low.  So I recommend starting an over-the-counter daily zinc.  We can recheck your level in 6 to 8 weeks.

## 2022-05-02 ENCOUNTER — Other Ambulatory Visit: Payer: Self-pay

## 2022-05-02 DIAGNOSIS — E60 Dietary zinc deficiency: Secondary | ICD-10-CM

## 2022-05-03 ENCOUNTER — Other Ambulatory Visit: Payer: Self-pay | Admitting: Family Medicine

## 2022-05-03 DIAGNOSIS — E039 Hypothyroidism, unspecified: Secondary | ICD-10-CM

## 2022-05-03 DIAGNOSIS — I10 Essential (primary) hypertension: Secondary | ICD-10-CM

## 2022-05-21 ENCOUNTER — Telehealth: Payer: Self-pay | Admitting: Family Medicine

## 2022-05-21 NOTE — Telephone Encounter (Signed)
Mailed results  

## 2022-05-21 NOTE — Telephone Encounter (Signed)
Patient called again about lab results for zinc states she never received a copy in the mail Please advise

## 2022-05-29 ENCOUNTER — Telehealth: Payer: Self-pay | Admitting: Family Medicine

## 2022-05-29 NOTE — Telephone Encounter (Signed)
FYI: Results of Zinc test mailed to pt per her request.

## 2022-05-30 NOTE — Telephone Encounter (Signed)
Per result notes this was done.

## 2022-08-04 ENCOUNTER — Other Ambulatory Visit: Payer: Self-pay | Admitting: Family Medicine

## 2022-08-04 DIAGNOSIS — E039 Hypothyroidism, unspecified: Secondary | ICD-10-CM

## 2022-09-06 ENCOUNTER — Other Ambulatory Visit: Payer: Self-pay | Admitting: Family Medicine

## 2022-09-06 DIAGNOSIS — Z1231 Encounter for screening mammogram for malignant neoplasm of breast: Secondary | ICD-10-CM

## 2022-10-04 ENCOUNTER — Telehealth: Payer: Self-pay | Admitting: *Deleted

## 2022-10-04 ENCOUNTER — Ambulatory Visit (INDEPENDENT_AMBULATORY_CARE_PROVIDER_SITE_OTHER): Payer: No Typology Code available for payment source | Admitting: Family Medicine

## 2022-10-04 ENCOUNTER — Ambulatory Visit: Payer: No Typology Code available for payment source

## 2022-10-04 ENCOUNTER — Encounter: Payer: Self-pay | Admitting: Family Medicine

## 2022-10-04 VITALS — BP 136/73 | HR 79 | Ht 63.0 in | Wt 204.0 lb

## 2022-10-04 DIAGNOSIS — I10 Essential (primary) hypertension: Secondary | ICD-10-CM

## 2022-10-04 DIAGNOSIS — Z23 Encounter for immunization: Secondary | ICD-10-CM | POA: Diagnosis not present

## 2022-10-04 DIAGNOSIS — F439 Reaction to severe stress, unspecified: Secondary | ICD-10-CM

## 2022-10-04 DIAGNOSIS — G8929 Other chronic pain: Secondary | ICD-10-CM

## 2022-10-04 DIAGNOSIS — E60 Dietary zinc deficiency: Secondary | ICD-10-CM

## 2022-10-04 DIAGNOSIS — R7301 Impaired fasting glucose: Secondary | ICD-10-CM | POA: Diagnosis not present

## 2022-10-04 DIAGNOSIS — L918 Other hypertrophic disorders of the skin: Secondary | ICD-10-CM

## 2022-10-04 DIAGNOSIS — E559 Vitamin D deficiency, unspecified: Secondary | ICD-10-CM | POA: Diagnosis not present

## 2022-10-04 DIAGNOSIS — M5440 Lumbago with sciatica, unspecified side: Secondary | ICD-10-CM

## 2022-10-04 NOTE — Telephone Encounter (Signed)
Pt wanted to know if Dr. Linford Arnold could refer her to a female dermatologist. She has some skin tags in her groin area and would like to be seen by a female only!  Also she wanted to know if Dr. Linford Arnold could refer her to an "older" female GI for her colonoscopy. She said that who so was going to previously has retired and he was really good.  I told her that I would fwd this to Dr.  Linford Arnold for advice

## 2022-10-05 NOTE — Progress Notes (Signed)
Call patient: A1c looks stable at 5.8 still in the prediabetes range but stable.  Metabolic panel looks great.  Vitamin D looks much better.  Continue with extra vitamin D and we will plan to recheck again in 6 months.

## 2022-10-05 NOTE — Assessment & Plan Note (Signed)
Encouraged her once again to consider getting a sit/stand desk I think that would be really helpful to get pressure off her spine.  We also discussed the possibility of formal physical therapy.  For now she wanted to just take a copy of the exercises to do on her own at home again.  But again I am happy to work up further or refer for PT at any point if she feels like that would be helpful for her.

## 2022-10-05 NOTE — Assessment & Plan Note (Signed)
Unfortunately her weight has gone up a little bit since I last saw her.  Just really encouraged her to continue to work on healthy diet and staying active and trying to exercise as best she can.

## 2022-10-05 NOTE — Assessment & Plan Note (Signed)
Blood pressure looks better today at 136 but is still just a little borderline and really like to see that systolic under 130.  But it does look much better.  Continue to work on healthy diet and regular exercise.

## 2022-10-05 NOTE — Assessment & Plan Note (Signed)
Continues to be a stressful home environment as her sister who has never worked is currently living with her she has been trying to get her assistance in trying to help her get a job.  But it has been wearing on her.

## 2022-10-05 NOTE — Telephone Encounter (Signed)
Placed dermatology referral but was not sure what diagnosis we were asking GI to consult for?  It looks like her colonoscopy is up to date so just was not sure what we needed to put on that 1

## 2022-10-05 NOTE — Assessment & Plan Note (Signed)
Lab Results  Component Value Date   HGBA1C 5.8 (H) 10/04/2022   1C5.8 still in the prediabetic range.  Continue to work on healthy diet and regular exercise.

## 2022-10-05 NOTE — Assessment & Plan Note (Signed)
Plan to recheck zinc levels today.

## 2022-10-05 NOTE — Telephone Encounter (Signed)
Attempted call to patient. Left a voice mail message requesting a return call.  

## 2022-10-06 LAB — CMP14+EGFR
ALT: 13 [IU]/L (ref 0–32)
AST: 14 [IU]/L (ref 0–40)
Albumin: 4.3 g/dL (ref 3.9–4.9)
Alkaline Phosphatase: 76 [IU]/L (ref 44–121)
BUN/Creatinine Ratio: 14 (ref 12–28)
BUN: 10 mg/dL (ref 8–27)
Bilirubin Total: 0.7 mg/dL (ref 0.0–1.2)
CO2: 22 mmol/L (ref 20–29)
Calcium: 9.4 mg/dL (ref 8.7–10.3)
Chloride: 103 mmol/L (ref 96–106)
Creatinine, Ser: 0.69 mg/dL (ref 0.57–1.00)
Globulin, Total: 2.6 g/dL (ref 1.5–4.5)
Glucose: 95 mg/dL (ref 70–99)
Potassium: 4.3 mmol/L (ref 3.5–5.2)
Sodium: 141 mmol/L (ref 134–144)
Total Protein: 6.9 g/dL (ref 6.0–8.5)
eGFR: 97 mL/min/{1.73_m2} (ref >59)

## 2022-10-06 LAB — HEMOGLOBIN A1C
Est. average glucose Bld gHb Est-mCnc: 120 mg/dL
Hgb A1c MFr Bld: 5.8 % — ABNORMAL HIGH (ref 4.8–5.6)

## 2022-10-06 LAB — VITAMIN D 25 HYDROXY (VIT D DEFICIENCY, FRACTURES): Vit D, 25-Hydroxy: 37.2 ng/mL (ref 30.0–100.0)

## 2022-10-06 LAB — ZINC: Zinc: 80 ug/dL (ref 44–115)

## 2022-10-08 NOTE — Progress Notes (Signed)
Zinc level looks great.  So no sign of deficiency.  Vitamin D is improving and looks better but continue with daily vitamin D supplement.

## 2022-10-10 NOTE — Telephone Encounter (Signed)
Elizabeth Harrison called back and states she doesn't need a referral. She just wanted to know if Dr Linford Arnold new a good female dermatologist.

## 2022-10-11 NOTE — Telephone Encounter (Signed)
I do not necessarily have 1 specific provider to recommend.  But we do have several good offices in our area.

## 2022-10-17 NOTE — Telephone Encounter (Signed)
Patient informed and placed lab results in mail again today to [address], [place], [place] [address].

## 2022-10-18 ENCOUNTER — Encounter: Payer: Self-pay | Admitting: Family Medicine

## 2022-10-25 ENCOUNTER — Ambulatory Visit: Payer: No Typology Code available for payment source

## 2022-11-04 ENCOUNTER — Other Ambulatory Visit: Payer: Self-pay | Admitting: Family Medicine

## 2022-11-04 DIAGNOSIS — I10 Essential (primary) hypertension: Secondary | ICD-10-CM

## 2022-11-04 DIAGNOSIS — E039 Hypothyroidism, unspecified: Secondary | ICD-10-CM

## 2022-11-22 ENCOUNTER — Ambulatory Visit: Payer: No Typology Code available for payment source

## 2023-02-09 ENCOUNTER — Other Ambulatory Visit: Payer: Self-pay | Admitting: Family Medicine

## 2023-02-09 DIAGNOSIS — E039 Hypothyroidism, unspecified: Secondary | ICD-10-CM

## 2023-02-18 IMAGING — MG MM DIGITAL SCREENING BILAT W/ TOMO AND CAD
6 of 10 series · 6 of 30 positions shown · non-contrast
Comparison: Previous exam(s).

CLINICAL DATA: Screening.

EXAM:
DIGITAL SCREENING BILATERAL MAMMOGRAM WITH TOMOSYNTHESIS AND CAD
TECHNIQUE: Bilateral screening digital craniocaudal and mediolateral oblique
mammograms were obtained. Bilateral screening digital breast
tomosynthesis was performed. The images were evaluated with
computer-aided detection.

[L CC synth-2D]
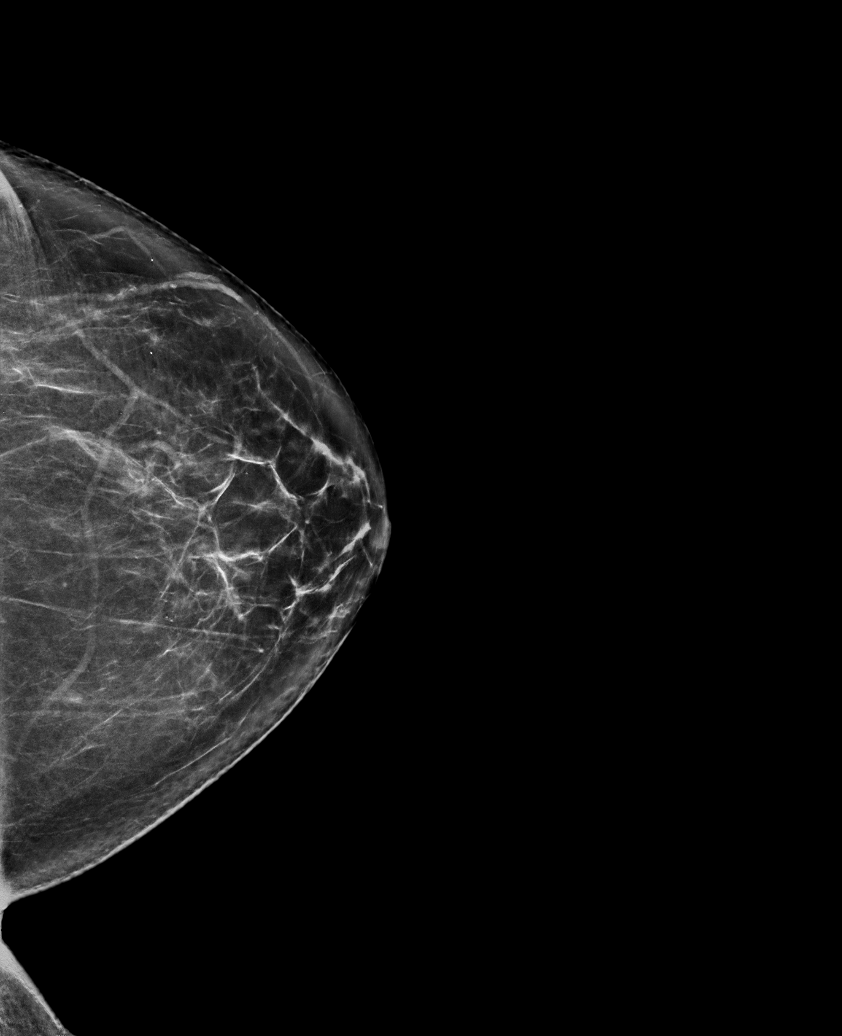

[L MLO synth-2D (1 of 2)]
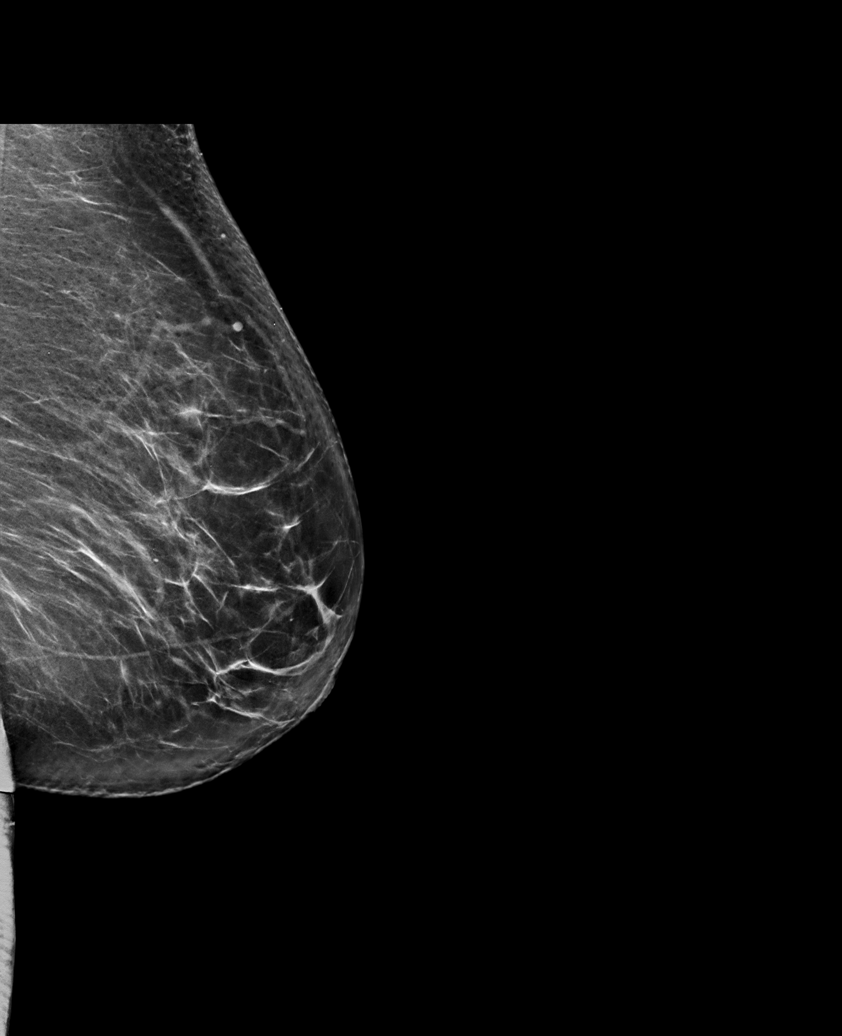

[L MLO synth-2D (2 of 2)]
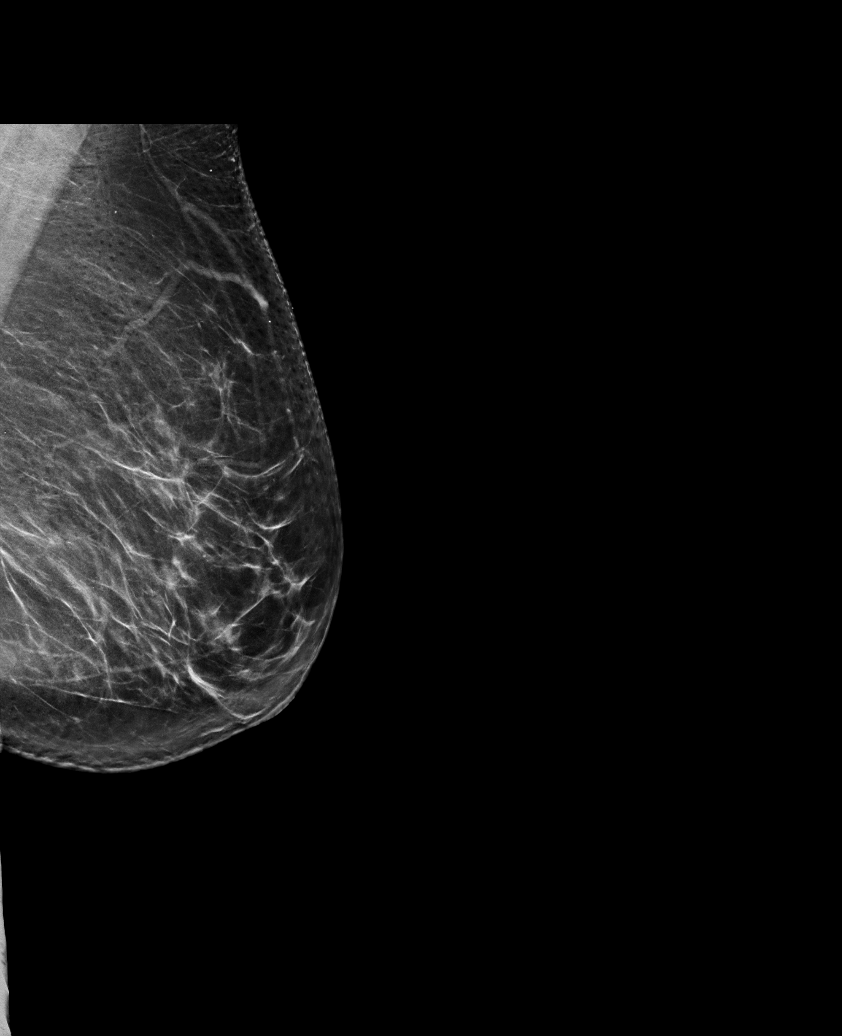

[R MLO synth-2D]
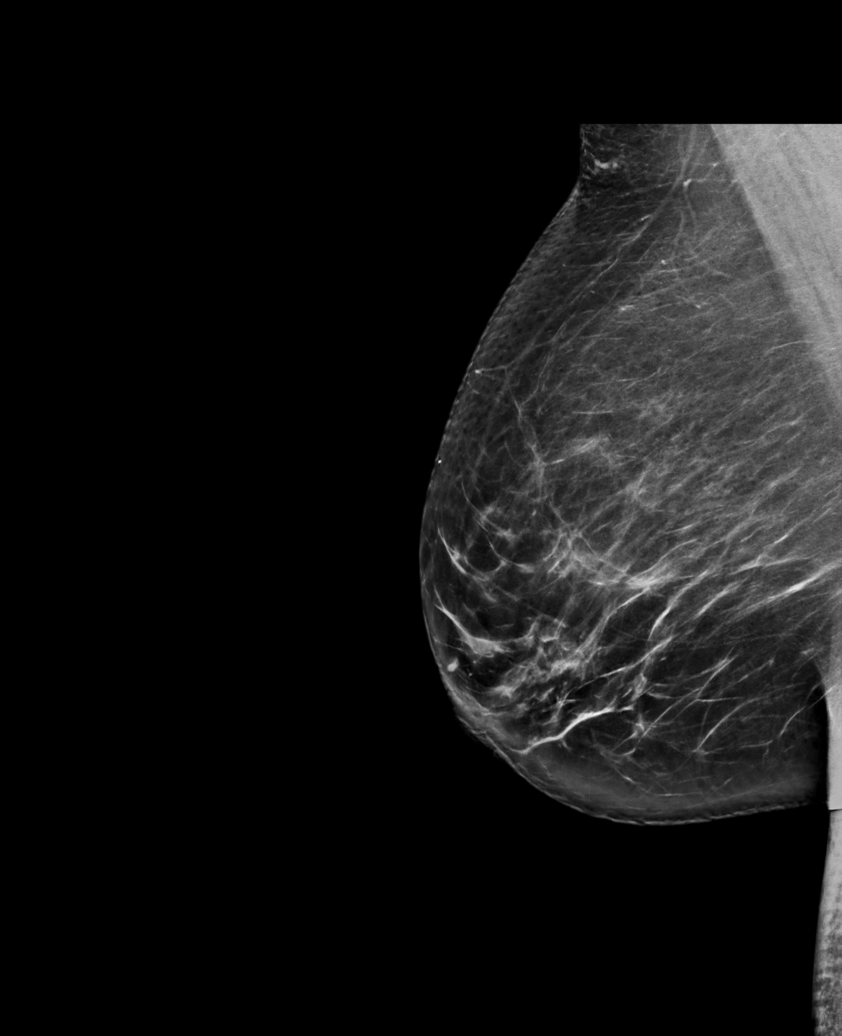

[R CC synth-2D]
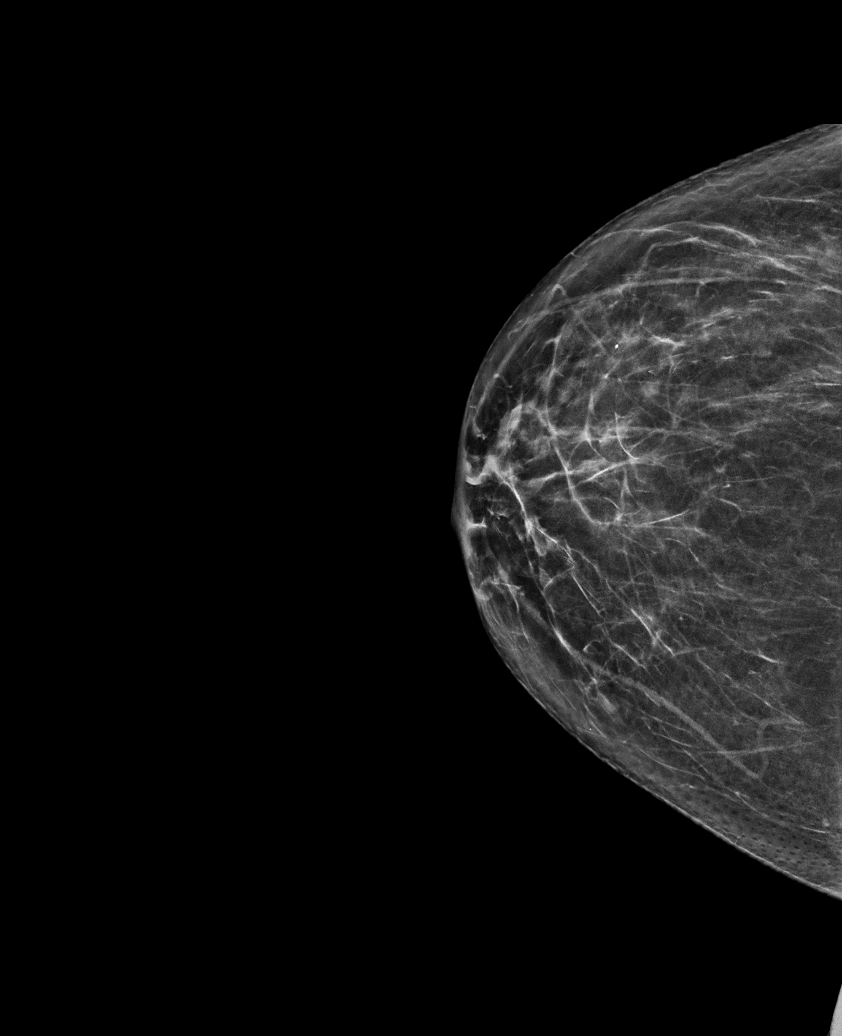

[L MLO tomo · tomo slice 41/80.0]
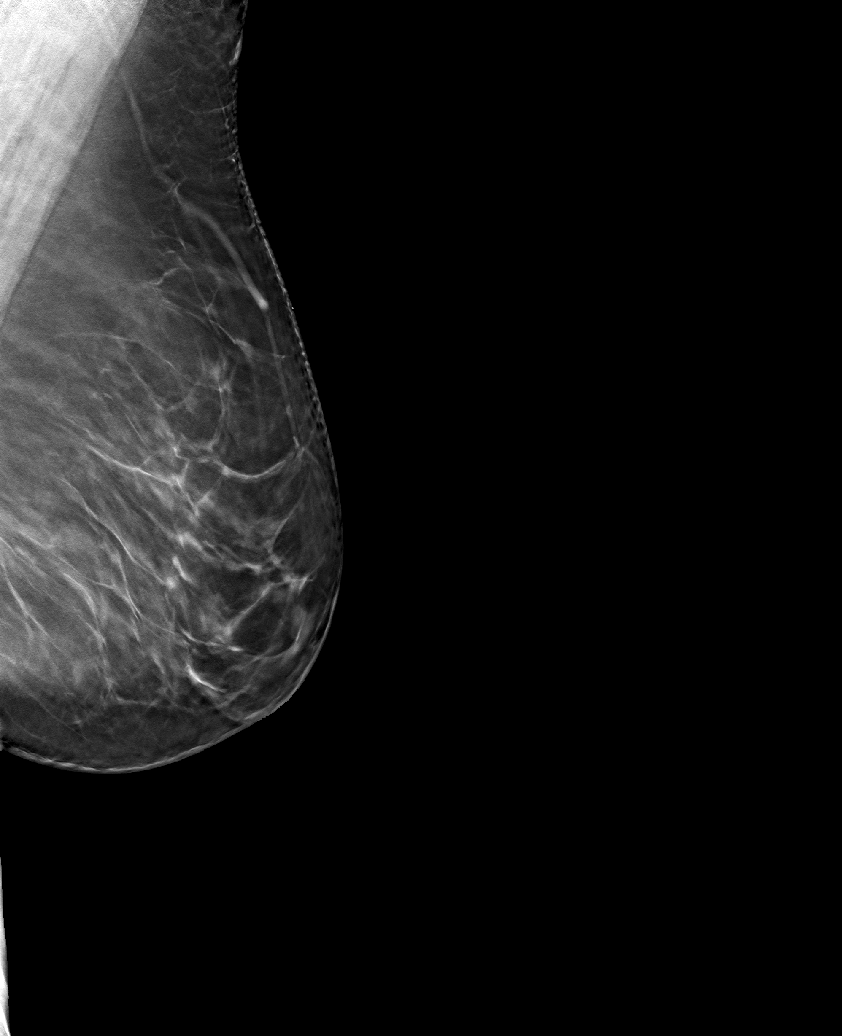

[6 of 30 positions shown; findings below may reference images not displayed]

ACR Breast Density Category c: The breast tissue is heterogeneously
dense, which may obscure small masses.
FINDINGS: There are no findings suspicious for malignancy.
IMPRESSION: No mammographic evidence of malignancy. A result letter of this
screening mammogram will be mailed directly to the patient.

RECOMMENDATION:
Screening mammogram in one year. (Code:[id number]-W-BC3)

BI-RADS CATEGORY  1: Negative.

## 2023-04-04 ENCOUNTER — Encounter: Payer: Self-pay | Admitting: Family Medicine

## 2023-04-04 ENCOUNTER — Ambulatory Visit (INDEPENDENT_AMBULATORY_CARE_PROVIDER_SITE_OTHER): Payer: No Typology Code available for payment source | Admitting: Family Medicine

## 2023-04-04 VITALS — BP 130/82 | HR 71 | Ht 63.0 in | Wt 204.0 lb

## 2023-04-04 DIAGNOSIS — E782 Mixed hyperlipidemia: Secondary | ICD-10-CM

## 2023-04-04 DIAGNOSIS — R7301 Impaired fasting glucose: Secondary | ICD-10-CM | POA: Diagnosis not present

## 2023-04-04 DIAGNOSIS — E039 Hypothyroidism, unspecified: Secondary | ICD-10-CM

## 2023-04-04 DIAGNOSIS — I10 Essential (primary) hypertension: Secondary | ICD-10-CM | POA: Diagnosis not present

## 2023-04-04 DIAGNOSIS — E559 Vitamin D deficiency, unspecified: Secondary | ICD-10-CM

## 2023-04-04 DIAGNOSIS — Z1211 Encounter for screening for malignant neoplasm of colon: Secondary | ICD-10-CM

## 2023-04-04 MED ORDER — METOPROLOL SUCCINATE ER 50 MG PO TB24: 50.0000 mg | ORAL_TABLET | Freq: Every day | ORAL | 2 refills | Status: DC

## 2023-04-04 MED ORDER — LEVOTHYROXINE SODIUM 75 MCG PO TABS: ORAL_TABLET | ORAL | 1 refills | Status: DC

## 2023-04-04 NOTE — Assessment & Plan Note (Signed)
 Due for updated lipid panel

## 2023-04-04 NOTE — Assessment & Plan Note (Signed)
 Check TSH.  She feels like she is doing well.

## 2023-04-04 NOTE — Assessment & Plan Note (Signed)
 BP just under 140.  Will recheck again today. She says she is having a bad day today and feels like tha tis affecting her BP.

## 2023-04-04 NOTE — Progress Notes (Signed)
 Established Patient Office Visit  Subjective  Patient ID: RMONI KEPLINGER, female    DOB: 1960-07-13  Age: 63 y.o. MRN: [id number]  Chief Complaint  Patient presents with   ifg   Hypertension    HPI  Impaired fasting glucose-no increased thirst or urination. No symptoms consistent with hypoglycemia.  Hypothyroidism - Taking medication regularly in the AM away from food and vitamins, etc. No recent change to skin, hair, or energy levels.  Still under a lot of stress.  Her sister who has not been able to get an apartment and has been living with her.  She said she gave her an ultimatum to move out by the end of March but it looks like that is probably not going to happen.  She also works from home.  Recently started taking zinc and says that she does feel better she feels like she has more energy.    ROS    Objective:     BP 136/86   Pulse 71   Ht 5\' 3"  (1.6 m)   Wt 204 lb (92.5 kg)   SpO2 97%   BMI 36.14 kg/m    Physical Exam Vitals and nursing note reviewed.  Constitutional:      Appearance: Normal appearance.  HENT:     Head: Normocephalic and atraumatic.  Eyes:     Conjunctiva/sclera: Conjunctivae normal.  Cardiovascular:     Rate and Rhythm: Normal rate and regular rhythm.  Pulmonary:     Effort: Pulmonary effort is normal.     Breath sounds: Normal breath sounds.  Skin:    General: Skin is warm and dry.  Neurological:     Mental Status: She is alert.  Psychiatric:        Mood and Affect: Mood normal.      No results found for any visits on 04/04/23.    The 10-year ASCVD risk score (Arnett DK, et al., 2019) is: 8.1%    Assessment & Plan:   Problem List Items Addressed This Visit       Cardiovascular and Mediastinum   Essential hypertension - Primary   BP just under 140.  Will recheck again today. She says she is having a bad day today and feels like tha tis affecting her BP.        Relevant Medications   metoprolol succinate  (TOPROL-XL) 50 MG 24 hr tablet   Other Relevant Orders   Vitamin D (25 hydroxy)   HgB A1c   CMP14+EGFR   Lipid Panel With LDL/HDL Ratio     Endocrine   Impaired fasting glucose   Due for A1C.  Weight is stable.        Relevant Orders   Vitamin D (25 hydroxy)   HgB A1c   CMP14+EGFR   Lipid Panel With LDL/HDL Ratio   Acquired hypothyroidism   Check TSH.  She feels like she is doing well.       Relevant Medications   metoprolol succinate (TOPROL-XL) 50 MG 24 hr tablet   levothyroxine (SYNTHROID) 75 MCG tablet   Other Relevant Orders   Vitamin D (25 hydroxy)   HgB A1c   CMP14+EGFR   Lipid Panel With LDL/HDL Ratio     Other   Mixed hyperlipidemia   Due for updated lipid panel.      Relevant Medications   metoprolol succinate (TOPROL-XL) 50 MG 24 hr tablet   Other Visit Diagnoses       Vitamin D deficiency  Relevant Orders   Vitamin D (25 hydroxy)   HgB A1c   CMP14+EGFR   Lipid Panel With LDL/HDL Ratio     Screen for colon cancer       Relevant Orders   Ambulatory referral to Gastroenterology      I did her to schedule her mammogram she says she has a specific lady/technician that she likes to use and plans on calling to get in with her.  Referral placed for screening colonoscopy.  She would like to see Dr. Mariana Kaufman who I think is fantastic.  Order placed today.  Return in about 6 months (around 10/05/2023) for thyroid and BP.    Nani Gasser, MD

## 2023-04-04 NOTE — Assessment & Plan Note (Signed)
 Due for A1C.  Weight is stable.

## 2023-04-05 LAB — LIPID PANEL WITH LDL/HDL RATIO
Cholesterol, Total: 193 mg/dL (ref 100–199)
HDL: 40 mg/dL (ref >39)
LDL Chol Calc (NIH): 124 mg/dL — ABNORMAL HIGH (ref 0–99)
LDL/HDL Ratio: 3.1 ratio (ref 0.0–3.2)
Triglycerides: 164 mg/dL — ABNORMAL HIGH (ref 0–149)
VLDL Cholesterol Cal: 29 mg/dL (ref 5–40)

## 2023-04-05 LAB — CMP14+EGFR
ALT: 17 IU/L (ref 0–32)
AST: 19 IU/L (ref 0–40)
Albumin: 4.4 g/dL (ref 3.9–4.9)
Alkaline Phosphatase: 75 IU/L (ref 44–121)
BUN/Creatinine Ratio: 11 — ABNORMAL LOW (ref 12–28)
BUN: 9 mg/dL (ref 8–27)
Bilirubin Total: 0.7 mg/dL (ref 0.0–1.2)
CO2: 24 mmol/L (ref 20–29)
Calcium: 10 mg/dL (ref 8.7–10.3)
Chloride: 101 mmol/L (ref 96–106)
Creatinine, Ser: 0.82 mg/dL (ref 0.57–1.00)
Globulin, Total: 2.5 g/dL (ref 1.5–4.5)
Glucose: 93 mg/dL (ref 70–99)
Potassium: 4.6 mmol/L (ref 3.5–5.2)
Sodium: 139 mmol/L (ref 134–144)
Total Protein: 6.9 g/dL (ref 6.0–8.5)
eGFR: 80 mL/min/{1.73_m2} (ref >59)

## 2023-04-05 LAB — HEMOGLOBIN A1C
Est. average glucose Bld gHb Est-mCnc: 120 mg/dL
Hgb A1c MFr Bld: 5.8 % — ABNORMAL HIGH (ref 4.8–5.6)

## 2023-04-05 LAB — VITAMIN D 25 HYDROXY (VIT D DEFICIENCY, FRACTURES): Vit D, 25-Hydroxy: 33 ng/mL (ref 30.0–100.0)

## 2023-04-05 NOTE — Progress Notes (Signed)
 Call patient: A1c is 5.8 in the prediabetes range it has been stable over the last year just continue to work on healthy food choices and increasing activity level.  Metabolic panel looks good.  Triglycerides and LDL are mildly elevated.  Will continue to try to work on reducing those numbers.  Vitamin D is normal but it is on the low end so still recommend continuing with your daily vitamin D supplement of 25 mcg daily.  The 10-year ASCVD risk score (Arnett DK, et al., 2019) is: 7.9%   Values used to calculate the score:     Age: 63 years     Sex: Female     Is Non-Hispanic African American: No     Diabetic: No     Tobacco smoker: No     Systolic Blood Pressure: 130 mmHg     Is BP treated: Yes     HDL Cholesterol: 40 mg/dL     Total Cholesterol: 193 mg/dL

## 2023-05-27 ENCOUNTER — Encounter: Payer: Self-pay | Admitting: Family Medicine

## 2023-05-27 ENCOUNTER — Ambulatory Visit (INDEPENDENT_AMBULATORY_CARE_PROVIDER_SITE_OTHER): Admitting: Family Medicine

## 2023-05-27 VITALS — BP 129/89 | HR 85 | Ht 63.0 in | Wt 208.2 lb

## 2023-05-27 DIAGNOSIS — J301 Allergic rhinitis due to pollen: Secondary | ICD-10-CM | POA: Diagnosis not present

## 2023-05-27 DIAGNOSIS — R051 Acute cough: Secondary | ICD-10-CM | POA: Diagnosis not present

## 2023-05-27 MED ORDER — FLUTICASONE PROPIONATE 50 MCG/ACT NA SUSP: 2.0000 | Freq: Every day | NASAL | 6 refills | Status: AC

## 2023-05-27 MED ORDER — HYDROCODONE BIT-HOMATROP MBR 5-1.5 MG/5ML PO SOLN: 5.0000 mL | Freq: Three times a day (TID) | ORAL | 0 refills | Status: DC | PRN

## 2023-05-27 NOTE — Progress Notes (Signed)
 Acute Office Visit  Subjective:     Patient ID: Elizabeth Harrison, female    DOB: [birth year], 63 y.o.   MRN: [id number]  Chief Complaint  Patient presents with   Bronchitis   Cough    HPI Patient is in today for congestion and cough today.  She says it really started a little over a week ago she thought it was just her allergies.  She has been taking a Claritin here and there.  But yesterday after mowing the grass it got so bad and she was coughing so hard that she was actually vomiting.  She never had that happen before.  Today it feels somewhat better she is not coughing nearly as much as she was yesterday she still has had a runny nose.  No fevers or chills.  History of asthma she had allergy shots for quite some time.  ROS      Objective:    BP 129/89   Pulse 85   Ht 5\' 3"  (1.6 m)   Wt 208 lb 3.2 oz (94.4 kg)   SpO2 98%   BMI 36.88 kg/m    Physical Exam Constitutional:      Appearance: Normal appearance.  HENT:     Head: Normocephalic and atraumatic.     Right Ear: Ear canal and external ear normal. There is no impacted cerumen.     Left Ear: Ear canal and external ear normal. There is no impacted cerumen.     Ears:     Comments: Blockec by cerumen bilaterally     Nose: Nose normal.     Mouth/Throat:     Pharynx: Oropharynx is clear.  Eyes:     Conjunctiva/sclera: Conjunctivae normal.  Cardiovascular:     Rate and Rhythm: Normal rate and regular rhythm.  Pulmonary:     Effort: Pulmonary effort is normal.     Breath sounds: Normal breath sounds.  Musculoskeletal:     Cervical back: Neck supple. No tenderness.  Lymphadenopathy:     Cervical: No cervical adenopathy.  Skin:    General: Skin is warm and dry.  Neurological:     Mental Status: She is alert and oriented to person, place, and time.  Psychiatric:        Mood and Affect: Mood normal.     No results found for any visits on 05/27/23.      Assessment & Plan:   Problem List Items  Addressed This Visit       Respiratory   Allergic rhinitis - Primary   Relevant Medications   fluticasone  (FLONASE ) 50 MCG/ACT nasal spray   Other Visit Diagnoses       Acute cough       Relevant Medications   HYDROcodone  bit-homatropine (HYCODAN) 5-1.5 MG/5ML syrup      I think her symptoms are most consistent with allergic rhinitis and acute cough chest is completely clear today and she is not having any significant mucus production or wheezing.  Discussed switching to Flonase  since she is also having a lot of dry throat issues I think stopping the antihistamine would help with that.  Also gave her a short prescription of hydrocodone  to use especially if the coughing is so severe that she is vomiting though it is better today.  Blood pressure was initially elevated we will recheck again today.  Meds ordered this encounter  Medications   fluticasone  (FLONASE ) 50 MCG/ACT nasal spray    Sig: Place 2 sprays into both nostrils daily.  Dispense:  16 g    Refill:  6   HYDROcodone  bit-homatropine (HYCODAN) 5-1.5 MG/5ML syrup    Sig: Take 5 mLs by mouth every 8 (eight) hours as needed for cough.    Dispense:  60 mL    Refill:  0    No follow-ups on file.  Duaine German, MD

## 2023-06-28 ENCOUNTER — Encounter: Payer: Self-pay | Admitting: Family Medicine

## 2023-06-28 ENCOUNTER — Ambulatory Visit
Admission: RE | Admit: 2023-06-28 | Discharge: 2023-06-28 | Disposition: A | Discharge status: Home / Self Care | Source: Ambulatory Visit | Attending: Family Medicine | Admitting: Family Medicine

## 2023-06-28 DIAGNOSIS — Z1231 Encounter for screening mammogram for malignant neoplasm of breast: Secondary | ICD-10-CM

## 2023-07-02 ENCOUNTER — Ambulatory Visit: Payer: Self-pay | Admitting: Family Medicine

## 2023-07-02 NOTE — Progress Notes (Signed)
 Please call patient. Normal mammogram.  Repeat in 1 year.

## 2023-10-04 ENCOUNTER — Ambulatory Visit: Admitting: Family Medicine

## 2023-10-24 ENCOUNTER — Encounter: Payer: Self-pay | Admitting: Family Medicine

## 2023-10-24 ENCOUNTER — Ambulatory Visit: Admitting: Family Medicine

## 2023-10-24 VITALS — BP 124/64 | HR 63 | Ht 63.0 in | Wt 199.0 lb

## 2023-10-24 DIAGNOSIS — R7989 Other specified abnormal findings of blood chemistry: Secondary | ICD-10-CM | POA: Diagnosis not present

## 2023-10-24 DIAGNOSIS — R7301 Impaired fasting glucose: Secondary | ICD-10-CM | POA: Diagnosis not present

## 2023-10-24 DIAGNOSIS — E60 Dietary zinc deficiency: Secondary | ICD-10-CM

## 2023-10-24 DIAGNOSIS — Z1211 Encounter for screening for malignant neoplasm of colon: Secondary | ICD-10-CM

## 2023-10-24 DIAGNOSIS — L918 Other hypertrophic disorders of the skin: Secondary | ICD-10-CM

## 2023-10-24 DIAGNOSIS — Z23 Encounter for immunization: Secondary | ICD-10-CM

## 2023-10-24 DIAGNOSIS — E039 Hypothyroidism, unspecified: Secondary | ICD-10-CM

## 2023-10-24 DIAGNOSIS — I10 Essential (primary) hypertension: Secondary | ICD-10-CM | POA: Diagnosis not present

## 2023-10-24 NOTE — Assessment & Plan Note (Signed)
 Impaired fasting glucose (prediabetes) Prediabetes with A1c between 5.7 and 6.4. No family history of diabetes. Significant weight loss and dietary changes noted. - Monitor A1c every six months. - Encourage continued weight loss and dietary modifications.

## 2023-10-24 NOTE — Progress Notes (Signed)
 Established Patient Office Visit  Subjective  Patient ID: Elizabeth Harrison, female    DOB: 04/06/1960  Age: 63 y.o. MRN: [id number]  Chief Complaint  Patient presents with   Hypertension   Hypothyroidism    HPI Discussed the use of AI scribe software for clinical note transcription with the patient, who gave verbal consent to proceed.  History of Present Illness Elizabeth Harrison is a 63 year old female who presents for evaluation of persistent sciatic nerve pain and prediabetes management.  Sciatic nerve pain, right - Persistent sciatic nerve pain, described as excruciating at times - Pain worsens with lying on her right side and prolonged sitting, especially due to sedentary work at [employer] - Pain improves when not sitting for extended periods, such as during vacations - Side sleeping aggravates symptoms - History of a fall in a wet bathroom, possibly contributing to current symptoms; no formal work-up or paperwork filed - Requires medication for pain management - No imaging studies performed  Lower extremity musculoskeletal symptoms - Approximately one year ago, experienced a sensation of pulling something in her leg, resulting in pain radiating from hip to ankle - Sensation of bone moving in her leg, which can be 'straightened out' by walking  Prediabetes and weight management - Monitoring A1c due to prediabetes; no family history of diabetes - Lost 21 pounds through dietary changes, including reducing shrimp and oyster intake and increasing vegetable consumption - Switched to bran cereal to address constipation  Thyroid function and supplementation - Takes thyroid medication daily with no current thyroid-related symptoms - Takes vitamin D  supplements - Interested in checking zinc  and vitamin D  levels  Cutaneous lesions and nail changes - Skin tags present under breasts - Concern about a condition on her hand referred to as 'Maggie May' - Nail issue in the groin  area - Requests evaluation by a female dermatologist  Colorectal cancer screening - Considering Cologuard test for colon cancer screening - Previously high risk due to a polyp found during prior colonoscopy - No recent colonoscopy performed     ROS    Objective:     BP 124/64   Pulse 63   Ht 5' 3 (1.6 m)   Wt 199 lb 0.6 oz (90.3 kg)   SpO2 99%   BMI 35.26 kg/m    Physical Exam Vitals and nursing note reviewed.  Constitutional:      Appearance: Normal appearance.  HENT:     Head: Normocephalic and atraumatic.  Eyes:     Conjunctiva/sclera: Conjunctivae normal.  Cardiovascular:     Rate and Rhythm: Normal rate and regular rhythm.  Pulmonary:     Effort: Pulmonary effort is normal.     Breath sounds: Normal breath sounds.  Skin:    General: Skin is warm and dry.  Neurological:     Mental Status: She is alert.  Psychiatric:        Mood and Affect: Mood normal.      No results found for any visits on 10/24/23.    The 10-year ASCVD risk score (Arnett DK, et al., 2019) is: 7.2%    Assessment & Plan:   Problem List Items Addressed This Visit       Cardiovascular and Mediastinum   Essential hypertension   BP looks great today.       Relevant Orders   HgB A1c   CMP14+EGFR   Zinc    TSH     Endocrine   Impaired fasting glucose   Impaired  fasting glucose (prediabetes) Prediabetes with A1c between 5.7 and 6.4. No family history of diabetes. Significant weight loss and dietary changes noted. - Monitor A1c every six months. - Encourage continued weight loss and dietary modifications.      Relevant Orders   HgB A1c   CMP14+EGFR   Zinc    TSH   Acquired hypothyroidism   Hypothyroidism Well-managed hypothyroidism with compliance to daily thyroid medication and no current symptoms.        Other   Low zinc  level   Plan to recheck       Relevant Orders   Zinc    Other Visit Diagnoses       Encounter for immunization    -  Primary    Relevant Orders   Flu vaccine trivalent PF, 6mos and older(Flulaval,Afluria,Fluarix,Fluzone) (Completed)     Low vitamin D  level       Relevant Orders   VITAMIN D  25 Hydroxy (Vit-D Deficiency, Fractures)     Multiple skin tags       Relevant Orders   Ambulatory referral to Dermatology     Screen for colon cancer       Relevant Orders   Cologuard      Assessment and Plan Assessment & Plan Chronic left leg pain (sciatica and musculoskeletal) Chronic left leg pain likely due to musculoskeletal injury from hip to ankle, exacerbated by certain positions, improved with walking and NSAIDs. - Encourage walking to alleviate pain. - Use Motrin as needed for pain management. - Consider orthopedic referral if pain worsens or persists.  Constipation Intermittent constipation, possibly age-related, managed with increased fiber and hydration. - Continue increased fiber intake and hydration.  Skin tags and benign skin lesions Presence of skin tags and benign lesions under the breast and on the inner thigh. Desires removal by a female dermatologist. - Refer to [facility] for evaluation and removal of skin tags and lesions.   Return in about 6 months (around 04/23/2024) for Wellness Exam.    Dorothyann Byars, MD

## 2023-10-24 NOTE — Assessment & Plan Note (Signed)
Plan to recheck.

## 2023-10-24 NOTE — Assessment & Plan Note (Signed)
BP looks great today!! 

## 2023-10-24 NOTE — Assessment & Plan Note (Signed)
 Hypothyroidism Well-managed hypothyroidism with compliance to daily thyroid medication and no current symptoms.

## 2023-10-25 ENCOUNTER — Ambulatory Visit: Payer: Self-pay | Admitting: Family Medicine

## 2023-10-25 NOTE — Progress Notes (Signed)
 Call patient: A1c is stable at 5.8 but still in the prediabetes range.  Metabolic panel looks good.  Thyroid looks perfect.  Vitamin D  is normal but on the low end.  Recommend taking 25 mcg daily.  Zinc  level is still pending.  Note patient would like a copy of her lab results mailed to her

## 2023-10-28 LAB — CMP14+EGFR
ALT: 17 IU/L (ref 0–32)
AST: 20 IU/L (ref 0–40)
Albumin: 4.6 g/dL (ref 3.9–4.9)
Alkaline Phosphatase: 78 IU/L (ref 49–135)
BUN/Creatinine Ratio: 17 (ref 12–28)
BUN: 14 mg/dL (ref 8–27)
Bilirubin Total: 0.9 mg/dL (ref 0.0–1.2)
CO2: 23 mmol/L (ref 20–29)
Calcium: 10.1 mg/dL (ref 8.7–10.3)
Chloride: 101 mmol/L (ref 96–106)
Creatinine, Ser: 0.81 mg/dL (ref 0.57–1.00)
Globulin, Total: 2.4 g/dL (ref 1.5–4.5)
Glucose: 97 mg/dL (ref 70–99)
Potassium: 4.7 mmol/L (ref 3.5–5.2)
Sodium: 140 mmol/L (ref 134–144)
Total Protein: 7 g/dL (ref 6.0–8.5)
eGFR: 81 mL/min/1.73 (ref >59)

## 2023-10-28 LAB — TSH: TSH: 0.905 u[IU]/mL (ref 0.450–4.500)

## 2023-10-28 LAB — HEMOGLOBIN A1C
Est. average glucose Bld gHb Est-mCnc: 120 mg/dL
Hgb A1c MFr Bld: 5.8 % — ABNORMAL HIGH (ref 4.8–5.6)

## 2023-10-28 LAB — VITAMIN D 25 HYDROXY (VIT D DEFICIENCY, FRACTURES): Vit D, 25-Hydroxy: 33 ng/mL (ref 30.0–100.0)

## 2023-10-28 LAB — ZINC: Zinc: 88 ug/dL (ref 44–115)

## 2023-10-28 NOTE — Progress Notes (Signed)
 Zinc  level looks good

## 2023-11-13 ENCOUNTER — Other Ambulatory Visit: Payer: Self-pay | Admitting: Family Medicine

## 2023-11-13 DIAGNOSIS — E039 Hypothyroidism, unspecified: Secondary | ICD-10-CM

## 2023-11-16 LAB — COLOGUARD: COLOGUARD: NEGATIVE

## 2023-11-18 NOTE — Progress Notes (Signed)
 Great news! Your Cologuard test is negative.  Recommend repeat colon cancer screening in 3 years.

## 2024-01-12 ENCOUNTER — Other Ambulatory Visit: Payer: Self-pay | Admitting: Family Medicine

## 2024-01-12 DIAGNOSIS — I10 Essential (primary) hypertension: Secondary | ICD-10-CM

## 2024-01-30 ENCOUNTER — Encounter: Admitting: Family Medicine

## 2024-02-13 ENCOUNTER — Other Ambulatory Visit: Payer: Self-pay | Admitting: Family Medicine

## 2024-02-13 DIAGNOSIS — E039 Hypothyroidism, unspecified: Secondary | ICD-10-CM

## 2024-04-23 ENCOUNTER — Encounter: Admitting: Family Medicine
